# Patient Record
Sex: Female | Born: 1940 | Race: White | Hispanic: No | Marital: Married | Smoking: Never smoker
Health system: Southern US, Community
[De-identification: ages and names within clinical notes are randomized; demographics above are authoritative.]

## PROBLEM LIST (undated history)

## (undated) DIAGNOSIS — R55 Syncope and collapse: Secondary | ICD-10-CM

## (undated) DIAGNOSIS — E039 Hypothyroidism, unspecified: Secondary | ICD-10-CM

## (undated) DIAGNOSIS — R011 Cardiac murmur, unspecified: Secondary | ICD-10-CM

## (undated) DIAGNOSIS — E119 Type 2 diabetes mellitus without complications: Secondary | ICD-10-CM

## (undated) DIAGNOSIS — I2699 Other pulmonary embolism without acute cor pulmonale: Secondary | ICD-10-CM

## (undated) DIAGNOSIS — M1990 Unspecified osteoarthritis, unspecified site: Secondary | ICD-10-CM

## (undated) DIAGNOSIS — I10 Essential (primary) hypertension: Secondary | ICD-10-CM

## (undated) HISTORY — PX: CATARACT EXTRACTION: SUR2

## (undated) HISTORY — DX: Cardiac murmur, unspecified: R01.1

## (undated) HISTORY — PX: ABDOMINAL HYSTERECTOMY: SHX81

## (undated) HISTORY — PX: TONSILLECTOMY: SUR1361

## (undated) HISTORY — PX: TUBAL LIGATION: SHX77

## (undated) HISTORY — DX: Syncope and collapse: R55

---

## 2002-12-24 ENCOUNTER — Encounter: Admission: RE | Admit: 2002-12-24 | Discharge: 2003-02-13 | Payer: Self-pay | Admitting: Orthopedic Surgery

## 2003-11-11 ENCOUNTER — Emergency Department (HOSPITAL_COMMUNITY): Admission: AC | Admit: 2003-11-11 | Discharge: 2003-11-11 | Payer: Self-pay

## 2009-06-25 ENCOUNTER — Encounter: Payer: Self-pay | Admitting: Cardiovascular Disease

## 2011-12-26 ENCOUNTER — Emergency Department (HOSPITAL_COMMUNITY): Payer: Medicare Other

## 2011-12-26 ENCOUNTER — Encounter (HOSPITAL_COMMUNITY): Payer: Self-pay | Admitting: Emergency Medicine

## 2011-12-26 ENCOUNTER — Inpatient Hospital Stay (HOSPITAL_COMMUNITY)
Admission: EM | Admit: 2011-12-26 | Discharge: 2011-12-28 | DRG: 312 | Disposition: A | Discharge status: Home / Self Care | Payer: Medicare Other | Source: Ambulatory Visit | Attending: Internal Medicine | Admitting: Internal Medicine

## 2011-12-26 DIAGNOSIS — E039 Hypothyroidism, unspecified: Secondary | ICD-10-CM | POA: Diagnosis present

## 2011-12-26 DIAGNOSIS — E860 Dehydration: Secondary | ICD-10-CM | POA: Diagnosis present

## 2011-12-26 DIAGNOSIS — R55 Syncope and collapse: Principal | ICD-10-CM | POA: Diagnosis present

## 2011-12-26 DIAGNOSIS — I10 Essential (primary) hypertension: Secondary | ICD-10-CM | POA: Diagnosis present

## 2011-12-26 HISTORY — DX: Essential (primary) hypertension: I10

## 2011-12-26 HISTORY — DX: Hypothyroidism, unspecified: E03.9

## 2011-12-26 HISTORY — DX: Unspecified osteoarthritis, unspecified site: M19.90

## 2011-12-26 LAB — CBC
HCT: 42.4 % (ref 36.0–46.0)
Hemoglobin: 14.2 g/dL (ref 12.0–15.0)
MCH: 31.8 pg (ref 26.0–34.0)
MCHC: 33.5 g/dL (ref 30.0–36.0)
RDW: 13.3 % (ref 11.5–15.5)

## 2011-12-26 LAB — POCT I-STAT, CHEM 8
BUN: 33 mg/dL — ABNORMAL HIGH (ref 6–23)
Calcium, Ion: 1.22 mmol/L (ref 1.12–1.32)
Chloride: 105 mEq/L (ref 96–112)
HCT: 46 % (ref 36.0–46.0)
Potassium: 4.6 mEq/L (ref 3.5–5.1)
Sodium: 142 mEq/L (ref 135–145)

## 2011-12-26 LAB — POCT I-STAT TROPONIN I: Troponin i, poc: 0 ng/mL (ref 0.00–0.08)

## 2011-12-26 MED ORDER — SODIUM CHLORIDE 0.9 % IV SOLN
INTRAVENOUS | Status: DC
  Administered 2011-12-27 (×2): via INTRAVENOUS

## 2011-12-26 NOTE — ED Notes (Signed)
Per EMS:  Pt ambulated to the bathroom and pt reports not feeling well while she was in the bathtub - Pt attempted to get out of the bathtub and had a syncopal episode and hit her head on the edge of the tub.  Pt attempted to sit up and continues to feel dizzy.  Pt feels better when she is lying flat.  Presently pt c/o dizziness.

## 2011-12-26 NOTE — ED Provider Notes (Signed)
History     CSN: [id number]  Arrival date & time 12/26/11  2217   First MD Initiated Contact with Patient 12/26/11 2300      Chief Complaint  Patient presents with  . Loss of Consciousness    (Consider location/radiation/quality/duration/timing/severity/associated sxs/prior treatment) HPI History provided by patient. At home tonight taking a bath. She states she did not feel right and was unable to characterize this any further. She denies lightheadedness or near syncopal feeling. When she went to step out of the bath the next thing she recalls is her husband standing over her. Family bedside, states her husband heard her fall and got to her within 30 seconds. She looks like she hit the back of her head on the bathtub and does complain of posterior head pain without bleeding. Her she was acting confused and for a period of 20-30 minutes. She sat up and had a second syncopal event without any seizure activity. She denies any chest pain or shortness of breath during these episodes. Her blood pressure has been reportedly labile recently. She has been started on a pressure medication by her primary care physician in [place]. No history of arrhythmia or known heart disease. No recent illness or fevers or chills or cough. She denies any abdominal pain, nausea vomiting or diarrhea. Her daughter who lives down the road came over shortly after this happened and witnessed her second syncopal. She was concerned that she had some right facial droop for a period of minutes it resolved. No unilateral weakness otherwise. No history of stroke. No history of similar symptoms. Patient now states she feels fine and no longer with this feeling of "doesn't feel right". Blood sugar reported 200s in route by EMS.  Past Medical History  Diagnosis Date  . Hypertension     Past Surgical History  Procedure Date  . Abdominal hysterectomy     History reviewed. No pertinent family history.  History  Substance  Use Topics  . Smoking status: Never Smoker   . Smokeless tobacco: Not on file  . Alcohol Use: No    OB History    Grav Para Term Preterm Abortions TAB SAB Ect Mult Living                  Review of Systems  Constitutional: Negative for fever and chills.  HENT: Negative for neck pain and neck stiffness.   Eyes: Negative for pain.  Respiratory: Negative for shortness of breath.   Cardiovascular: Negative for chest pain.  Gastrointestinal: Negative for abdominal pain.  Genitourinary: Negative for dysuria.  Musculoskeletal: Negative for back pain.  Skin: Negative for rash.  Neurological: Positive for syncope and weakness. Negative for headaches.  All other systems reviewed and are negative.    Allergies  Review of patient's allergies indicates no known allergies.  Home Medications   Current Outpatient Rx  Name Route Sig Dispense Refill  . AMLODIPINE BESYLATE 5 MG PO TABS Oral Take 5 mg by mouth daily.    Marland Kitchen CALCIUM CARBONATE-VITAMIN D 500-200 MG-UNIT PO TABS Oral Take 1 tablet by mouth daily.    Marland Kitchen HYDROCHLOROTHIAZIDE 12.5 MG PO CAPS Oral Take 12.5 mg by mouth daily.    Marland Kitchen LEVOTHYROXINE SODIUM 112 MCG PO TABS Oral Take 112 mcg by mouth daily.      BP 126/69  Pulse 106  Temp(Src) 98 F (36.7 C) (Oral)  Resp 13  SpO2 99%  Physical Exam  Constitutional: She is oriented to person, place, and time. She  appears well-developed and well-nourished.  HENT:  Head: Normocephalic.       Posterior scalp hematoma left-sided. No laceration. TMs clear. No associated cervical tenderness or deformity.  Eyes: Conjunctivae and EOM are normal. Pupils are equal, round, and reactive to light.  Neck: Trachea normal. Neck supple.       No midline tenderness  Cardiovascular: Normal rate, regular rhythm, S1 normal, S2 normal and normal pulses.     No systolic murmur is present   No diastolic murmur is present  Pulses:      Radial pulses are 2+ on the right side, and 2+ on the left side.    Pulmonary/Chest: Effort normal and breath sounds normal. She has no wheezes. She has no rhonchi. She has no rales. She exhibits no tenderness.  Abdominal: Soft. Normal appearance and bowel sounds are normal. There is no tenderness. There is no rebound, no guarding, no CVA tenderness and negative Murphy's sign.  Musculoskeletal:       BLE:s Calves nontender, no cords or erythema, negative Homans sign  Neurological: She is alert and oriented to person, place, and time. She has normal strength. No cranial nerve deficit or sensory deficit. GCS eye subscore is 4. GCS verbal subscore is 5. GCS motor subscore is 6.       Equal grips, biceps, triceps and dorsi plantar flexion. Sensorium to light touch intact throughout. No facial droop or unilateral deficits  Skin: Skin is warm and dry. No rash noted. She is not diaphoretic.  Psychiatric: Her speech is normal.       Cooperative and appropriate    ED Course  Procedures (including critical care time)  Results for orders placed during the hospital encounter of 12/26/11  CBC      Component Value Range   WBC 18.1 (*) 4.0 - 10.5 (K/uL)   RBC 4.47  3.87 - 5.11 (MIL/uL)   Hemoglobin 14.2  12.0 - 15.0 (g/dL)   HCT 81.1  91.4 - 78.2 (%)   MCV 94.9  78.0 - 100.0 (fL)   MCH 31.8  26.0 - 34.0 (pg)   MCHC 33.5  30.0 - 36.0 (g/dL)   RDW 95.6  21.3 - 08.6 (%)   Platelets 232  150 - 400 (K/uL)  COMPREHENSIVE METABOLIC PANEL      Component Value Range   Sodium 141  135 - 145 (mEq/L)   Potassium 4.7  3.5 - 5.1 (mEq/L)   Chloride 102  96 - 112 (mEq/L)   CO2 29  19 - 32 (mEq/L)   Glucose, Bld 143 (*) 70 - 99 (mg/dL)   BUN 31 (*) 6 - 23 (mg/dL)   Creatinine, Ser 5.78  0.50 - 1.10 (mg/dL)   Calcium 9.6  8.4 - 46.9 (mg/dL)   Total Protein 7.2  6.0 - 8.3 (g/dL)   Albumin 3.7  3.5 - 5.2 (g/dL)   AST 22  0 - 37 (U/L)   ALT 22  0 - 35 (U/L)   Alkaline Phosphatase 86  39 - 117 (U/L)   Total Bilirubin 0.3  0.3 - 1.2 (mg/dL)   GFR calc non Af Amer 53 (*) >90  (mL/min)   GFR calc Af Amer 61 (*) >90 (mL/min)  URINALYSIS, ROUTINE W REFLEX MICROSCOPIC      Component Value Range   Color, Urine YELLOW  YELLOW    APPearance CLOUDY (*) CLEAR    Specific Gravity, Urine 1.024  1.005 - 1.030    pH 5.0  5.0 - 8.0  Glucose, UA NEGATIVE  NEGATIVE (mg/dL)   Hgb urine dipstick NEGATIVE  NEGATIVE    Bilirubin Urine NEGATIVE  NEGATIVE    Ketones, ur 15 (*) NEGATIVE (mg/dL)   Protein, ur NEGATIVE  NEGATIVE (mg/dL)   Urobilinogen, UA 0.2  0.0 - 1.0 (mg/dL)   Nitrite NEGATIVE  NEGATIVE    Leukocytes, UA MODERATE (*) NEGATIVE   POCT I-STAT, CHEM 8      Component Value Range   Sodium 142  135 - 145 (mEq/L)   Potassium 4.6  3.5 - 5.1 (mEq/L)   Chloride 105  96 - 112 (mEq/L)   BUN 33 (*) 6 - 23 (mg/dL)   Creatinine, Ser 1.61  0.50 - 1.10 (mg/dL)   Glucose, Bld 096 (*) 70 - 99 (mg/dL)   Calcium, Ion 0.45  4.09 - 1.32 (mmol/L)   TCO2 30  0 - 100 (mmol/L)   Hemoglobin 15.6 (*) 12.0 - 15.0 (g/dL)   HCT 81.1  91.4 - 78.2 (%)  POCT I-STAT TROPONIN I      Component Value Range   Troponin i, poc 0.00  0.00 - 0.08 (ng/mL)   Comment 3           URINE MICROSCOPIC-ADD ON      Component Value Range   Squamous Epithelial / LPF MANY (*) RARE    WBC, UA 3-6  <3 (WBC/hpf)   RBC / HPF 0-2  <3 (RBC/hpf)   Bacteria, UA RARE  RARE    Dg Chest 2 View  12/27/2011  *RADIOLOGY REPORT*  Clinical Data: Syncope.  Dizziness.  CHEST - 2 VIEW  Comparison: 11/11/2003  Findings: No cardiomegaly is observed. The lungs are clear.  Reduced definition the left hemidiaphragm is probably due to angulation during imaging and epicardial adipose tissue, as no lower lobe opacity is observed on the lateral projection.  Thoracic spondylosis noted.  IMPRESSION:  1. Thoracic spondylosis.   Otherwise, no significant abnormality identified.  Original Report Authenticated By: Dellia Cloud, M.D.   Ct Head Wo Contrast  12/27/2011  *RADIOLOGY REPORT*  Clinical Data: Fall.  Head injury.  Pain.   CT HEAD WITHOUT CONTRAST  Technique:  Contiguous axial images were obtained from the base of the skull through the vertex without contrast.  Comparison: None.  Findings: The brain stem, cerebellum, cerebral peduncles, thalami, basal ganglia, basilar cisterns, and ventricular system appear unremarkable.  No intracranial hemorrhage, mass lesion, or acute infarction is identified.  Mild periventricular white matter hypodensity probably reflects chronic ischemic microvascular white matter disease.  IMPRESSION:  1.  Mild chronic ischemic microvascular white matter disease. Otherwise, no significant abnormality identified.  Original Report Authenticated By: Dellia Cloud, M.D.      Date: 12/26/2011  Rate: 75  Rhythm: normal sinus rhythm  QRS Axis: normal  Intervals: normal  ST/T Wave abnormalities: nonspecific ST changes  Conduction Disutrbances:none  Narrative Interpretation: PAC present  Old EKG Reviewed: none available   Asymptomatic in the ED with serial evaluations.  MDM   Multiple syncopal evidence home with workup as above. Stat EKG without ischemia or arrhythmia. Labs obtained and reviewed as above. Chest x-ray and CAT scan obtained and reviewed. Medicine consult for admission is just as above with Dr. Conley Rolls, who agrees to admit.         Sunnie Nielsen, MD 12/27/11 [phone]

## 2011-12-27 ENCOUNTER — Observation Stay (HOSPITAL_COMMUNITY): Payer: Medicare Other

## 2011-12-27 ENCOUNTER — Encounter (HOSPITAL_COMMUNITY): Payer: Self-pay | Admitting: General Practice

## 2011-12-27 DIAGNOSIS — R55 Syncope and collapse: Secondary | ICD-10-CM

## 2011-12-27 DIAGNOSIS — R569 Unspecified convulsions: Secondary | ICD-10-CM

## 2011-12-27 DIAGNOSIS — I10 Essential (primary) hypertension: Secondary | ICD-10-CM

## 2011-12-27 LAB — COMPREHENSIVE METABOLIC PANEL
ALT: 22 U/L (ref 0–35)
AST: 22 U/L (ref 0–37)
Albumin: 3.7 g/dL (ref 3.5–5.2)
Alkaline Phosphatase: 86 U/L (ref 39–117)
BUN: 31 mg/dL — ABNORMAL HIGH (ref 6–23)
CO2: 29 mEq/L (ref 19–32)
Calcium: 9.6 mg/dL (ref 8.4–10.5)
Chloride: 102 mEq/L (ref 96–112)
Creatinine, Ser: 1.04 mg/dL (ref 0.50–1.10)
GFR calc Af Amer: 61 mL/min — ABNORMAL LOW (ref >90)
GFR calc non Af Amer: 53 mL/min — ABNORMAL LOW (ref >90)
Glucose, Bld: 143 mg/dL — ABNORMAL HIGH (ref 70–99)
Potassium: 4.7 mEq/L (ref 3.5–5.1)
Sodium: 141 mEq/L (ref 135–145)
Total Bilirubin: 0.3 mg/dL (ref 0.3–1.2)
Total Protein: 7.2 g/dL (ref 6.0–8.3)

## 2011-12-27 LAB — URINE MICROSCOPIC-ADD ON

## 2011-12-27 LAB — URINALYSIS, ROUTINE W REFLEX MICROSCOPIC
Bilirubin Urine: NEGATIVE
Glucose, UA: NEGATIVE mg/dL
Hgb urine dipstick: NEGATIVE
Ketones, ur: 15 mg/dL — AB
Ketones, ur: NEGATIVE mg/dL
Leukocytes, UA: NEGATIVE
Nitrite: NEGATIVE
Nitrite: NEGATIVE
Protein, ur: NEGATIVE mg/dL
Specific Gravity, Urine: 1.024 (ref 1.005–1.030)
Specific Gravity, Urine: 1.013 (ref 1.005–1.030)
Urobilinogen, UA: 0.2 mg/dL (ref 0.0–1.0)
Urobilinogen, UA: 0.2 mg/dL (ref 0.0–1.0)
pH: 5 (ref 5.0–8.0)
pH: 7.5 (ref 5.0–8.0)

## 2011-12-27 LAB — CREATININE, SERUM: Creatinine, Ser: 0.78 mg/dL (ref 0.50–1.10)

## 2011-12-27 LAB — CBC
MCH: 31.7 pg (ref 26.0–34.0)
Platelets: 208 10*3/uL (ref 150–400)
RBC: 3.91 MIL/uL (ref 3.87–5.11)
RDW: 13.6 % (ref 11.5–15.5)

## 2011-12-27 LAB — CARDIAC PANEL(CRET KIN+CKTOT+MB+TROPI)
CK, MB: 3.4 ng/mL (ref 0.3–4.0)
CK, MB: 3.1 ng/mL (ref 0.3–4.0)
Relative Index: INVALID (ref 0.0–2.5)
Troponin I: <0.3 ng/mL (ref <0.30)
Troponin I: <0.3 ng/mL (ref <0.30)

## 2011-12-27 LAB — TSH: TSH: 0.175 u[IU]/mL — ABNORMAL LOW (ref 0.350–4.500)

## 2011-12-27 MED ORDER — ONDANSETRON HCL 4 MG/2ML IJ SOLN: 4.0000 mg | Freq: Four times a day (QID) | INTRAMUSCULAR | Status: DC | PRN

## 2011-12-27 MED ORDER — SODIUM CHLORIDE 0.9 % IV SOLN: 250.0000 mL | INTRAVENOUS | Status: DC | PRN

## 2011-12-27 MED ORDER — LEVOTHYROXINE SODIUM 112 MCG PO TABS
112.0000 ug | ORAL_TABLET | Freq: Every day | ORAL | Status: DC
  Administered 2011-12-27 – 2011-12-28 (×2): 112 ug via ORAL
  Filled 2011-12-27 (×5): qty 1

## 2011-12-27 MED ORDER — SODIUM CHLORIDE 0.9 % IJ SOLN: 3.0000 mL | Freq: Two times a day (BID) | INTRAMUSCULAR | Status: DC

## 2011-12-27 MED ORDER — AMLODIPINE BESYLATE 5 MG PO TABS
5.0000 mg | ORAL_TABLET | Freq: Every day | ORAL | Status: DC
  Administered 2011-12-27 – 2011-12-28 (×2): 5 mg via ORAL
  Filled 2011-12-27 (×3): qty 1

## 2011-12-27 MED ORDER — ENOXAPARIN SODIUM 40 MG/0.4ML ~~LOC~~ SOLN
40.0000 mg | SUBCUTANEOUS | Status: DC
  Administered 2011-12-27: 40 mg via SUBCUTANEOUS
  Filled 2011-12-27 (×3): qty 0.4

## 2011-12-27 MED ORDER — SODIUM CHLORIDE 0.9 % IJ SOLN
3.0000 mL | Freq: Two times a day (BID) | INTRAMUSCULAR | Status: DC
  Administered 2011-12-28: 3 mL via INTRAVENOUS

## 2011-12-27 MED ORDER — DEXTROSE 5 % IV SOLN
1.0000 g | INTRAVENOUS | Status: DC
  Administered 2011-12-27 – 2011-12-28 (×2): 1 g via INTRAVENOUS
  Filled 2011-12-27 (×2): qty 10

## 2011-12-27 MED ORDER — IOHEXOL 350 MG/ML SOLN
60.0000 mL | Freq: Once | INTRAVENOUS | Status: AC | PRN
Start: 2011-12-27 — End: 2011-12-27
  Administered 2011-12-27: 60 mL via INTRAVENOUS

## 2011-12-27 MED ORDER — ASPIRIN EC 325 MG PO TBEC
325.0000 mg | DELAYED_RELEASE_TABLET | Freq: Every day | ORAL | Status: DC
  Administered 2011-12-27 – 2011-12-28 (×2): 325 mg via ORAL
  Filled 2011-12-27 (×2): qty 1

## 2011-12-27 MED ORDER — CALCIUM CARBONATE-VITAMIN D 500-200 MG-UNIT PO TABS
1.0000 | ORAL_TABLET | Freq: Every day | ORAL | Status: DC
  Administered 2011-12-27 – 2011-12-28 (×2): 1 via ORAL
  Filled 2011-12-27 (×3): qty 1

## 2011-12-27 MED ORDER — ONDANSETRON HCL 4 MG PO TABS: 4.0000 mg | ORAL_TABLET | Freq: Four times a day (QID) | ORAL | Status: DC | PRN

## 2011-12-27 MED ORDER — SODIUM CHLORIDE 0.9 % IJ SOLN: 3.0000 mL | INTRAMUSCULAR | Status: DC | PRN

## 2011-12-27 NOTE — H&P (Signed)
PCP:   Dwana Melena, MD, MD   Chief Complaint: 2 episodes of syncope.   HPI: Claudia Rocha is an 71 y.o. female with history of hypertension, recent compressive fracture of her spine, returned from her trip to [place], about to take a shower, felt lightheaded, and had a frank syncope. There was no reported of seizure activity, bowel bladder incontinence, chest pain or shortness of breath, but stated she had transient vertigo. She told me she has no confusion after this event. Her daughter who works as an Charity fundraiser for home health care to assist with. She stated she felt vertiginous when she turns her head. She suffered another episode of transient loss of consciousness. Though her daughter thought she might have transient facial droop, her husband and her denied such symptoms. She has no focal weakness, paresthesia, slurred speech, or any visual problems. Workup in emergency room included a head CT which was negative, a white count of 18,000, hemoglobin of 14.2 g per decaliter, normal creatinine, and unremarkable chest x-ray. Her UA shows evidence of many squamous epithelials. Her EKG was unremarkable. Hospitalist was asked to admit her for syncopal workup. Currently she is totally asymptomatic.  Rewiew of Systems:  The patient denies anorexia, fever, weight loss,, vision loss, decreased hearing, hoarseness, chest pain,  dyspnea on exertion, peripheral edema, balance deficits, hemoptysis, abdominal pain, melena, hematochezia, severe indigestion/heartburn, hematuria, incontinence, genital sores, muscle weakness, suspicious skin lesions, transient blindness, difficulty walking, depression, unusual weight change, abnormal bleeding, enlarged lymph nodes, angioedema, and breast masses.    Past Medical History  Diagnosis Date  . Hypertension     Past Surgical History  Procedure Date  . Abdominal hysterectomy     Medications:  HOME MEDS: Prior to Admission medications   Medication Sig Start Date End  Date Taking? Authorizing Provider  amLODipine (NORVASC) 5 MG tablet Take 5 mg by mouth daily.   Yes Historical Provider, MD  calcium-vitamin D (OSCAL WITH D) 500-200 MG-UNIT per tablet Take 1 tablet by mouth daily.   Yes Historical Provider, MD  hydrochlorothiazide (MICROZIDE) 12.5 MG capsule Take 12.5 mg by mouth daily.   Yes Historical Provider, MD  levothyroxine (SYNTHROID, LEVOTHROID) 112 MCG tablet Take 112 mcg by mouth daily.   Yes Historical Provider, MD     Allergies:  No Known Allergies  Social History:   reports that she has never smoked. She does not have any smokeless tobacco history on file. She reports that she does not drink alcohol. Her drug history not on file.  Family History: History reviewed. No pertinent family history.   Physical Exam: Filed Vitals:   12/27/11 0130 12/27/11 0145 12/27/11 0200 12/27/11 0215  BP: 122/56 125/59 136/62 124/55  Pulse: 76 72 72 71  Temp:      TempSrc:      Resp:      SpO2: 95% 94% 94% 93%   Blood pressure 124/55, pulse 71, temperature 98.4 F (36.9 C), temperature source Oral, resp. rate 16, SpO2 93.00%.  GEN:  Pleasant  person lying in the stretcher in no acute distress; cooperative with exam PSYCH:  alert and oriented x4; does not appear anxious or depressed; affect is appropriate. HEENT: Mucous membranes pink and anicteric; PERRLA; EOM intact; no cervical lymphadenopathy nor thyromegaly or carotid bruit; no JVD; Breasts:: Not examined CHEST WALL: No tenderness CHEST: Normal respiration, clear to auscultation bilaterally HEART: Regular rate and rhythm; no murmurs rubs or gallops BACK: No kyphosis or scoliosis; no CVA tenderness ABDOMEN: Obese, soft non-tender;  no masses, no organomegaly, normal abdominal bowel sounds; no pannus; no intertriginous candida. Rectal Exam: Not done EXTREMITIES: No bone or joint deformity; age-appropriate arthropathy of the hands and knees; no edema; no ulcerations. Genitalia: not  examined PULSES: 2+ and symmetric SKIN: Normal hydration no rash or ulceration CNS: Cranial nerves 2-12 grossly intact no focal lateralizing neurologic deficit. She has  facial symmetry with fluent speech. Tongue is midline and uvula elevated with phonation. Cerebellar exam is negative.   Labs & Imaging Results for orders placed during the hospital encounter of 12/26/11 (from the past 48 hour(s))  CBC     Status: Abnormal   Collection Time   12/26/11 11:15 PM      Component Value Range Comment   WBC 18.1 (*) 4.0 - 10.5 (K/uL)    RBC 4.47  3.87 - 5.11 (MIL/uL)    Hemoglobin 14.2  12.0 - 15.0 (g/dL)    HCT 78.2  95.6 - 21.3 (%)    MCV 94.9  78.0 - 100.0 (fL)    MCH 31.8  26.0 - 34.0 (pg)    MCHC 33.5  30.0 - 36.0 (g/dL)    RDW 08.6  57.8 - 46.9 (%)    Platelets 232  150 - 400 (K/uL)   COMPREHENSIVE METABOLIC PANEL     Status: Abnormal   Collection Time   12/26/11 11:15 PM      Component Value Range Comment   Sodium 141  135 - 145 (mEq/L)    Potassium 4.7  3.5 - 5.1 (mEq/L)    Chloride 102  96 - 112 (mEq/L)    CO2 29  19 - 32 (mEq/L)    Glucose, Bld 143 (*) 70 - 99 (mg/dL)    BUN 31 (*) 6 - 23 (mg/dL)    Creatinine, Ser 6.29  0.50 - 1.10 (mg/dL)    Calcium 9.6  8.4 - 10.5 (mg/dL)    Total Protein 7.2  6.0 - 8.3 (g/dL)    Albumin 3.7  3.5 - 5.2 (g/dL)    AST 22  0 - 37 (U/L)    ALT 22  0 - 35 (U/L)    Alkaline Phosphatase 86  39 - 117 (U/L)    Total Bilirubin 0.3  0.3 - 1.2 (mg/dL)    GFR calc non Af Amer 53 (*) >90 (mL/min)    GFR calc Af Amer 61 (*) >90 (mL/min)   POCT I-STAT TROPONIN I     Status: Normal   Collection Time   12/26/11 11:39 PM      Component Value Range Comment   Troponin i, poc 0.00  0.00 - 0.08 (ng/mL)    Comment 3            POCT I-STAT, CHEM 8     Status: Abnormal   Collection Time   12/26/11 11:41 PM      Component Value Range Comment   Sodium 142  135 - 145 (mEq/L)    Potassium 4.6  3.5 - 5.1 (mEq/L)    Chloride 105  96 - 112 (mEq/L)    BUN 33 (*)  6 - 23 (mg/dL)    Creatinine, Ser 5.28  0.50 - 1.10 (mg/dL)    Glucose, Bld 413 (*) 70 - 99 (mg/dL)    Calcium, Ion 2.44  1.12 - 1.32 (mmol/L)    TCO2 30  0 - 100 (mmol/L)    Hemoglobin 15.6 (*) 12.0 - 15.0 (g/dL)    HCT 01.0  27.2 - 53.6 (%)  URINALYSIS, ROUTINE W REFLEX MICROSCOPIC     Status: Abnormal   Collection Time   12/27/11  1:01 AM      Component Value Range Comment   Color, Urine YELLOW  YELLOW     APPearance CLOUDY (*) CLEAR     Specific Gravity, Urine 1.024  1.005 - 1.030     pH 5.0  5.0 - 8.0     Glucose, UA NEGATIVE  NEGATIVE (mg/dL)    Hgb urine dipstick NEGATIVE  NEGATIVE     Bilirubin Urine NEGATIVE  NEGATIVE     Ketones, ur 15 (*) NEGATIVE (mg/dL)    Protein, ur NEGATIVE  NEGATIVE (mg/dL)    Urobilinogen, UA 0.2  0.0 - 1.0 (mg/dL)    Nitrite NEGATIVE  NEGATIVE     Leukocytes, UA MODERATE (*) NEGATIVE    URINE MICROSCOPIC-ADD ON     Status: Abnormal   Collection Time   12/27/11  1:01 AM      Component Value Range Comment   Squamous Epithelial / LPF MANY (*) RARE     WBC, UA 3-6  <3 (WBC/hpf)    RBC / HPF 0-2  <3 (RBC/hpf)    Bacteria, UA RARE  RARE     Dg Chest 2 View  12/27/2011  *RADIOLOGY REPORT*  Clinical Data: Syncope.  Dizziness.  CHEST - 2 VIEW  Comparison: 11/11/2003  Findings: No cardiomegaly is observed. The lungs are clear.  Reduced definition the left hemidiaphragm is probably due to angulation during imaging and epicardial adipose tissue, as no lower lobe opacity is observed on the lateral projection.  Thoracic spondylosis noted.  IMPRESSION:  1. Thoracic spondylosis.   Otherwise, no significant abnormality identified.  Original Report Authenticated By: Dellia Cloud, M.D.   Ct Head Wo Contrast  12/27/2011  *RADIOLOGY REPORT*  Clinical Data: Fall.  Head injury.  Pain.  CT HEAD WITHOUT CONTRAST  Technique:  Contiguous axial images were obtained from the base of the skull through the vertex without contrast.  Comparison: None.  Findings: The  brain stem, cerebellum, cerebral peduncles, thalami, basal ganglia, basilar cisterns, and ventricular system appear unremarkable.  No intracranial hemorrhage, mass lesion, or acute infarction is identified.  Mild periventricular white matter hypodensity probably reflects chronic ischemic microvascular white matter disease.  IMPRESSION:  1.  Mild chronic ischemic microvascular white matter disease. Otherwise, no significant abnormality identified.  Original Report Authenticated By: Dellia Cloud, M.D.      Assessment Present on Admission:  .Syncope and collapse .HTN (hypertension)   PLAN: Will admit her to telemetry for syncopal workup. Will check serial CPKs and troponins, monitor her rhythm, obtain echo for heart. Although I don't think she had a seizure, will obtain EEG. This is unlikely to be TIA or stroke. I therefore have not ordered carotid or MRI of her head. Given her long trip (over 300 miles each way), and although she has no tachycardia, chest pain or shortness of breath, I think it's prudent to exclude a PE causing her syncope. Will obtain a CT pulmonary angiogram. We'll admit her to telemetry under [facility] service. I have stopped her HCTZ, but will continue her other antihypertensive medications.   Other plans as per orders.    Arsh Feutz 12/27/2011, 3:11 AM

## 2011-12-27 NOTE — Evaluation (Signed)
Physical Therapy Evaluation Patient Details Name: Claudia Rocha MRN: [id number] DOB: 07/06/1941 Today's Date: 12/27/2011 Time: 7846-9629 PT Time Calculation (min): 24 min  PT Assessment / Plan / Recommendation Clinical Impression  Pt admitted after syncopal event at home with pt stating no dizziness preceeding but just didn't feel right. Pt reports some mild nausea with extreme left gaze with end range nystagmus noted. No difficulty or dizziness with visual tracking or saccades vertically or horizontally. Pt able to perform head thrust bilaterally negative, side to sit and sit to side bilaterally no issues and pt able to perform x 2 exercises without difficulty. Pt at baseline function and no further therapy needs identified.     PT Assessment  Patent does not need any further PT services    Follow Up Recommendations  No PT follow up    Barriers to Discharge        lEquipment Recommendations       Recommendations for Other Services     Frequency      Precautions / Restrictions Precautions Precautions: None   Pertinent Vitals/Pain No dizziness or pain      Mobility  Bed Mobility Bed Mobility: Rolling Right;Right Sidelying to Sit;Left Sidelying to Sit;Sitting - Scoot to [place] of Bed;Sit to Sidelying Right;Sit to Sidelying Left Rolling Right: 6: Modified independent (Device/Increase time) Right Sidelying to Sit: 6: Modified independent (Device/Increase time);HOB flat Left Sidelying to Sit: 6: Modified independent (Device/Increase time);HOB flat Sitting - Scoot to Edge of Bed: 6: Modified independent (Device/Increase time) Sit to Sidelying Right: 6: Modified independent (Device/Increase time);HOB flat Sit to Sidelying Left: 6: Modified independent (Device/Increase time);HOB flat Transfers Transfers: Sit to Stand;Stand to Sit Sit to Stand: 6: Modified independent (Device/Increase time) Stand to Sit: 6: Modified independent (Device/Increase  time) Ambulation/Gait Ambulation/Gait Assistance: 7: Independent Ambulation Distance (Feet): 400 Feet Assistive device: None Ambulation/Gait Assistance Details: no deviations with change in speed, direction, head turns Gait Pattern: Within Functional Limits Stairs: Yes Stairs Assistance: 6: Modified independent (Device/Increase time) Stair Management Technique: One rail Left Number of Stairs: 4     Exercises     PT Diagnosis:    PT Problem List:   PT Treatment Interventions:     PT Goals    Visit Information  Last PT Received On: 12/27/11 Assistance Needed: +1    Subjective Data  Subjective: I just felt funny then I woke up buck naked on the floor Patient Stated Goal: return home   Prior Functioning  Home Living Lives With: Spouse Type of Home: House Home Access: Stairs to enter Secretary/administrator of Steps: 6 Entrance Stairs-Rails: Right Home Layout: Multi-level Alternate Level Stairs-Number of Steps: 6 Alternate Level Stairs-Rails: Right Bathroom Shower/Tub: Engineer, manufacturing systems: Standard Home Adaptive Equipment: None Prior Function Level of Independence: Independent Able to Take Stairs?: Yes Driving: Yes Vocation: Retired Musician: No difficulties    Cognition  Overall Cognitive Status: Appears within functional limits for tasks assessed/performed Arousal/Alertness: Awake/alert Orientation Level: Appears intact for tasks assessed Behavior During Session: [facility] for tasks performed    Extremity/Trunk Assessment Right Upper Extremity Assessment RUE ROM/Strength/Tone: Within functional levels Left Upper Extremity Assessment LUE ROM/Strength/Tone: Within functional levels Right Lower Extremity Assessment RLE ROM/Strength/Tone: Within functional levels Left Lower Extremity Assessment LLE ROM/Strength/Tone: Within functional levels   Balance    End of Session PT - End of Session Equipment Utilized During Treatment: Gait  belt Activity Tolerance: Patient tolerated treatment well Patient left: in chair;with call bell/phone within reach;with family/visitor present  Toney Sang Beth 12/27/2011, 5:15 PM  Delaney Meigs, PT [phone]

## 2011-12-27 NOTE — ED Notes (Signed)
Awaiting transport to CT

## 2011-12-27 NOTE — ED Notes (Signed)
Attempted to give report.  Will call back in 15 minutes.

## 2011-12-27 NOTE — Progress Notes (Signed)
  Echocardiogram 2D Echocardiogram has been performed.  Cathie Beams Deneen 12/27/2011, 1:28 PM

## 2011-12-27 NOTE — Progress Notes (Signed)
Portable EEG completed

## 2011-12-27 NOTE — Progress Notes (Signed)
*  PRELIMINARY RESULTS* Vascular Ultrasound Carotid Duplex (Doppler) has been completed.  There is evidence of elevated left internal carotid artery velocities in the mid 60-79% range. No evidence of right internal carotid artery stenosis. Bilateral antegrade vertebral artery flow.  Malachy Moan, RDMS,RDCS 12/27/2011, 5:36 PM

## 2011-12-27 NOTE — Progress Notes (Signed)
Utilization review complete 

## 2011-12-27 NOTE — Progress Notes (Signed)
Patient seen and examined, admitted by Dr. Conley Rolls this morning, briefly 71 year old female with history of hypertension, recent compressive fracture of her spine presented with syncopal episode today. H&P done by Dr. Conley Rolls reviewed.  - UA positive for ketones and moderate leukocytes, placed on Rocephin, follow urine cultures - CT in June of the chest negative for any PE - 2-D echo and carotid Dopplers pending - Hold HCTZ, continue gentle hydration, PTOT eval   Claudia Rocha M.D. [facility] 12/27/2011, 11:34 AM  Pager: [phone]

## 2011-12-28 DIAGNOSIS — I10 Essential (primary) hypertension: Secondary | ICD-10-CM

## 2011-12-28 DIAGNOSIS — R55 Syncope and collapse: Secondary | ICD-10-CM

## 2011-12-28 DIAGNOSIS — E782 Mixed hyperlipidemia: Secondary | ICD-10-CM

## 2011-12-28 LAB — BASIC METABOLIC PANEL
BUN: 17 mg/dL (ref 6–23)
Calcium: 8.7 mg/dL (ref 8.4–10.5)
Creatinine, Ser: 0.65 mg/dL (ref 0.50–1.10)
GFR calc non Af Amer: 87 mL/min — ABNORMAL LOW (ref >90)
Glucose, Bld: 107 mg/dL — ABNORMAL HIGH (ref 70–99)
Sodium: 142 mEq/L (ref 135–145)

## 2011-12-28 LAB — CBC
MCH: 31.2 pg (ref 26.0–34.0)
MCHC: 32.5 g/dL (ref 30.0–36.0)
Platelets: 219 10*3/uL (ref 150–400)
RBC: 3.91 MIL/uL (ref 3.87–5.11)
RDW: 13.7 % (ref 11.5–15.5)

## 2011-12-28 MED ORDER — CIPROFLOXACIN HCL 500 MG PO TABS: 500.0000 mg | ORAL_TABLET | Freq: Two times a day (BID) | ORAL | Status: AC

## 2011-12-28 MED ORDER — ASPIRIN 325 MG PO TBEC: 325.0000 mg | DELAYED_RELEASE_TABLET | Freq: Every day | ORAL | Status: AC

## 2011-12-28 MED ORDER — LEVOTHYROXINE SODIUM 100 MCG PO TABS
100.0000 ug | ORAL_TABLET | Freq: Every day | ORAL | Status: DC
Start: 2011-12-28 — End: 2017-01-19

## 2011-12-28 NOTE — Discharge Summary (Addendum)
Patient ID: Claudia Rocha MRN: [id number] DOB/AGE: 72-Feb-1942 72 y.o.  Admit date: 12/26/2011 Discharge date: 12/28/2011  Primary Care Physician:  Dwana Melena, MD, MD   Discharge Diagnoses:    Present on Admission:  .Syncope and collapse .HTN (hypertension) Hypothyroidism  Suspected UTI  Medication List  As of 12/28/2011 12:53 PM   STOP taking these medications         hydrochlorothiazide 12.5 MG capsule         TAKE these medications         amLODipine 5 MG tablet   Commonly known as: NORVASC   Take 5 mg by mouth daily.      aspirin 325 MG EC tablet   Take 1 tablet (325 mg total) by mouth daily.      calcium-vitamin D 500-200 MG-UNIT per tablet   Commonly known as: OSCAL WITH D   Take 1 tablet by mouth daily.      ciprofloxacin 500 MG tablet   Commonly known as: CIPRO   Take 1 tablet (500 mg total) by mouth 2 (two) times daily.      levothyroxine 100 MCG tablet   Commonly known as: SYNTHROID, LEVOTHROID   Take 1 tablet (100 mcg total) by mouth daily.             Consults:  None    Significant Diagnostic Studies:  Dg Chest 2 View  12/27/2011  *RADIOLOGY REPORT*  Clinical Data: Syncope.  Dizziness.  CHEST - 2 VIEW  Comparison: 11/11/2003  Findings: No cardiomegaly is observed. The lungs are clear.  Reduced definition the left hemidiaphragm is probably due to angulation during imaging and epicardial adipose tissue, as no lower lobe opacity is observed on the lateral projection.  Thoracic spondylosis noted.  IMPRESSION:  1. Thoracic spondylosis.   Otherwise, no significant abnormality identified.  Original Report Authenticated By: Dellia Cloud, M.D.   Ct Head Wo Contrast  12/27/2011  *RADIOLOGY REPORT*  Clinical Data: Fall.  Head injury.  Pain.  CT HEAD WITHOUT CONTRAST  Technique:  Contiguous axial images were obtained from the base of the skull through the vertex without contrast.  Comparison: None.  Findings: The brain stem, cerebellum, cerebral  peduncles, thalami, basal ganglia, basilar cisterns, and ventricular system appear unremarkable.  No intracranial hemorrhage, mass lesion, or acute infarction is identified.  Mild periventricular white matter hypodensity probably reflects chronic ischemic microvascular white matter disease.  IMPRESSION:  1.  Mild chronic ischemic microvascular white matter disease. Otherwise, no significant abnormality identified.  Original Report Authenticated By: Dellia Cloud, M.D.   Ct Angio Chest W/cm &/or Wo Cm  12/27/2011  *RADIOLOGY REPORT*  Clinical Data: Syncope.  Elevated blood pressure.  CT ANGIOGRAPHY CHEST  Technique:  Multidetector CT imaging of the chest using the standard protocol during bolus administration of intravenous contrast. Multiplanar reconstructed images including MIPs were obtained and reviewed to evaluate the vascular anatomy.  Contrast: 60mL OMNIPAQUE IOHEXOL 350 MG/ML SOLN  Comparison: None.  Findings: Technically adequate study with good opacification of the central and segmental pulmonary arteries.  No focal filling defects.  No evidence of significant pulmonary embolus.  Normal caliber thoracic aorta with calcification.  Normal heart size. Small esophageal hiatal hernia.  The esophagus is decompressed.  No significant lymphadenopathy in the chest.  No pleural effusions. Visualized portions of the upper abdominal organs are unremarkable. Dependent atelectasis in the lung bases.  No focal consolidation. No pneumothorax.  No significant interstitial change.  Airways appear patent.  Respiratory  motion artifact limits visualization of the lung fields.  Degenerative changes in the thoracic spine.  IMPRESSION: No evidence of significant pulmonary embolus.  Original Report Authenticated By: Marlon Pel, M.D.    Brief H and P: For complete details please refer to admission H and P, but in brief Claudia Rocha is an 71 y.o. female with history of hypertension, recent compressive  fracture of her spine, returned from her trip to [place], about to take a shower, felt lightheaded, and had a frank syncope. There was no reported of seizure activity, bowel bladder incontinence, chest pain or shortness of breath, but stated she had transient vertigo. She told me she has no confusion after this event. Her daughter who works as an Charity fundraiser for home health care to assist with. She stated she felt vertiginous when she turns her head. She suffered another episode of transient loss of consciousness. Though her daughter thought she might have transient facial droop, her husband and her denied such symptoms. She has no focal weakness, paresthesia, slurred speech, or any visual problems. Workup in emergency room included a head CT which was negative, a white count of 18,000, hemoglobin of 14.2 g per decaliter, normal creatinine, and unremarkable chest x-ray. Her UA shows evidence of many squamous epithelials. Her EKG was unremarkable. Hospitalist was asked to admit her for syncopal workup. Currently she is totally asymptomatic.   Hospital Course:  Patient was admitted to telemetry floor, labs showed evidence of dehydration manifested by elevated BUN at 33 and hemoconcentration with a hemoglobin of 15.6 ,HCTZ was discontinued and patient was gently hydrated. Her BUN improved to 17 and her hemoglobin decreased to 12.2 with IV fluids. Vital checked for orthostasis on admission showed no orthostasis by blood pressure criteria however was positive for orthostasis by pulse criteria when her pulse rate increased from 80 lying 1 or 6 standing. no events noted on telemetry. Cardiac enzymes were unremarkable. CTA chest showed no PE, echocardiogram was unremarkable. Carotid Doppler preliminary report showed left sided ICA stenosis 60-79%, no evidence of right ICA stenosis,patient was started on aspirin. EEG was ordered on admission and showed normal result, there was no seizures reported by history. UA showed  leukocytes and 3-6 WBCs and white blood cells were elevated on admission at 18.1 the patient was started on Rocephin IV and white blood count decreased to explain to today, repeat UA is now negative. We'll discharge on ciprofloxacin by mouth to complete 5 days of antibiotics, urine culture results still pending at the time of discharge however patient is very eager to be discharged home and she will follow with her PCP on Friday 5/17. As far as hypertension, patient was kept on amlodipine 5 mg daily, HCTZ was discontinued as above, systolic blood pressure currently running between 140 to 150s, however I will continue with amlodipine 5 mg on discharge and patient was advised to check blood pressure daily and record it to discuss with PCP on Friday for further adjustment of amlodipine dose if needed, and given suspected orthostasis am reluctant to increase her antihypertensive medicine at this time. Patient was also educated on physical maneuvers with changing position to avoid dizziness. Patient was seen by physical therapy and had vestibular evaluation done, no PT services recommended on discharge to Patient was seen and examined by me today she stated that she is feeling much better, denies any dizziness with standing or ambulation, very eager for discharge to home today. Orthostatic vital signs repeated and showed no orthostasis today.  Filed Vitals:   12/28/11 1200  BP: 145/76  Pulse: 70  Temp: 98.4 F (36.9 C)  Resp: 17    General: Alert, awake, oriented x3, in no acute distress. HEENT: No bruits, no goiter. Heart: Regular rate and rhythm, without murmurs, rubs, gallops. Lungs: Clear to auscultation bilaterally. Abdomen: Soft, nontender, nondistended, positive bowel sounds. Extremities: No clubbing cyanosis or edema with positive pedal pulses. Neuro: Grossly intact, nonfocal.   Disposition and Follow-up:  To home With PCP as scheduled on Friday Time spent on Discharge: Approximately 45  minutes   Signed: Olivya Sobol 12/28/2011, 12:53 PM

## 2011-12-28 NOTE — Discharge Instructions (Signed)
STROKE/TIA DISCHARGE INSTRUCTIONS SMOKING Cigarette smoking nearly doubles your risk of having a stroke & is the single most alterable risk factor  If you smoke or have smoked in the last 12 months, you are advised to quit smoking for your health.  Most of the excess cardiovascular risk related to smoking disappears within a year of stopping.  Ask you doctor about anti-smoking medications  [place] Quit Line: 1-800-QUIT NOW  Free Smoking Cessation Classes [phone]  CHOLESTEROL Know your levels; limit fat & cholesterol in your diet  Lipid Panel  No results found for this basename: chol, trig, hdl, cholhdl, vldl, ldlcalc      Many patients benefit from treatment even if their cholesterol is at goal.  Goal: Total Cholesterol (CHOL) less than 160  Goal:  Triglycerides (TRIG) less than 150  Goal:  HDL greater than 40  Goal:  LDL (LDLCALC) less than 100   BLOOD PRESSURE American Stroke Association blood pressure target is less that 120/80 mm/Hg  Your discharge blood pressure is:  BP: 145/76 mmHg  Monitor your blood pressure  Limit your salt and alcohol intake  Many individuals will require more than one medication for high blood pressure  DIABETES (A1c is a blood sugar average for last 3 months) Goal HGBA1c is under 7% (HBGA1c is blood sugar average for last 3 months)  Diabetes: No known diagnosis of diabetes    No results found for this basename: HGBA1C     Your HGBA1c can be lowered with medications, healthy diet, and exercise.  Check your blood sugar as directed by your physician  Call your physician if you experience unexplained or low blood sugars.  PHYSICAL ACTIVITY/REHABILITATION Goal is 30 minutes at least 4 days per week  Activity: Increase activity slowly, Therapies:  Return to work:   Activity decreases your risk of heart attack and stroke and makes your heart stronger.  It helps control your weight and blood pressure; helps you relax and can improve your  mood.  Participate in a regular exercise program.  Talk with your doctor about the best form of exercise for you (dancing, walking, swimming, cycling).  DIET/WEIGHT Goal is to maintain a healthy weight  Your discharge diet is: Cardiac REGULAR liquids Your height is:  Height: 5\' 3"  (160 cm) Your current weight is: Weight: 84.5 kg (186 lb 4.6 oz) Your Body Mass Index (BMI) is:  BMI (Calculated): 33.1   Following the type of diet specifically designed for you will help prevent another stroke.  Your goal weight range is: 107-135  Your goal Body Mass Index (BMI) is 19-24.  Healthy food habits can help reduce 3 risk factors for stroke:  High cholesterol, hypertension, and excess weight.  RESOURCES Stroke/Support Group:  Call [phone]   STROKE EDUCATION PROVIDED/REVIEWED AND GIVEN TO PATIENT Stroke warning signs and symptoms How to activate emergency medical system (call 911). Medications prescribed at discharge. Need for follow-up after discharge. Personal risk factors for stroke. Pneumonia vaccine given: No Flu vaccine given: No My questions have been answered, the writing is legible, and I understand these instructions.  I will adhere to these goals & educational materials that have been provided to me after my discharge from the hospital.     Stroke Prevention Some medical conditions and behaviors are associated with an increased chance of having a stroke. You may prevent a stroke by making healthy choices and managing medical conditions. Reduce your risk of having a stroke by:  Staying physically active. Get at least  30 minutes of activity on most or all days.   Not smoking. It may also be helpful to avoid exposure to secondhand smoke.   Limiting alcohol use. Moderate alcohol use is considered to be:   No more than 2 drinks per day for men.   No more than 1 drink per day for nonpregnant women.   Eating healthy foods.   Include 5 or more servings of fruits and vegetables  a day.   Certain diets may be prescribed to address high blood pressure, high cholesterol, diabetes, or obesity.   Managing your cholesterol levels.   A low-saturated fat, low-trans fat, low-cholesterol, and high-fiber diet may control cholesterol levels.   Take any prescribed medicines to control cholesterol as directed by your caregiver.   Managing your diabetes.   A controlled-carbohydrate, controlled-sugar diet is recommended to manage diabetes.   Take any prescribed medicines to control diabetes as directed by your caregiver.   Controlling your high blood pressure (hypertension).   A low-salt (sodium), low-saturated fat, low-trans fat, and low-cholesterol diet is recommended to manage high blood pressure.   Take any prescribed medicines to control hypertension as directed by your caregiver.   Maintaining a healthy weight.   A reduced-calorie, low-sodium, low-saturated fat, low-trans fat, low-cholesterol diet is recommended to manage weight.   Stopping drug abuse.   Avoiding birth control pills.   Talk to your caregiver about the risks of taking birth control pills if you are over 69 years old, smoke, get migraines, or have ever had a blood clot.   Getting evaluated for sleep disorders (sleep apnea).   Talk to your caregiver about getting a sleep evaluation if you snore a lot or have excessive sleepiness.   Taking medicines as directed by your caregiver.   For some people, aspirin or blood thinners (anticoagulants) are helpful in reducing the risk of forming abnormal blood clots that can lead to stroke. If you have the irregular heart rhythm of atrial fibrillation, you should be on a blood thinner unless there is a good reason you cannot take them.   Understand all your medicine instructions.  SEEK IMMEDIATE MEDICAL CARE IF:   You have sudden weakness or numbness of the face, arm, or leg, especially on one side of the body.   You have sudden confusion.   You have  trouble speaking (aphasia) or understanding.   You have sudden trouble seeing in one or both eyes.   You have sudden trouble walking.   You have dizziness.   You have a loss of balance or coordination.   You have a sudden, severe headache with no known cause.   You have new chest pain or an irregular heartbeat.  Any of these symptoms may represent a serious problem that is an emergency. Do not wait to see if the symptoms will go away. Get medical help right away. Call your local emergency services (911 in [place] Do not drive yourself to the hospital. Document Released: 09/08/2004 Document Revised: 07/21/2011 Document Reviewed: 03/21/2011 [facility] Patient Information 2012 [place], [place].

## 2011-12-28 NOTE — Clinical Documentation Improvement (Signed)
GENERIC DOCUMENTATION CLARIFICATION QUERY  THIS DOCUMENT IS NOT A PERMANENT PART OF THE MEDICAL RECORD  TO RESPOND TO THE THIS QUERY, FOLLOW THE INSTRUCTIONS BELOW:  1. If needed, update documentation for the patient's encounter via the notes activity.  2. Access this query again and click edit on the In [employer].  3. After updating, or not, click F2 to complete all highlighted (required) fields concerning your review. Select "additional documentation in the medical record" OR "no additional documentation provided".  4. Click Sign note button.  5. The deficiency will fall out of your In Basket *Please let us know if you are not able to complete this workflow by phone or e-mail (listed below).  Please update your documentation within the medical record to reflect your response to this query.                                                                                        12/28/11   Dear Dr. Cleotis Lema / Associates,  In a better effort to capture your patient's severity of illness, reflect appropriate length of stay and utilization of resources, a review of the patient medical record has revealed the following indicators.  THANK YOU FOR CLARIFYING DIAGNOSIS BEING TREATED.  Possible Clinical Conditions? - UTI - Possible UTI - Other Condition (please specify) - Cannot Clinically Determine  Supporting Information: - Risk Factors: syncope, 5/14:"UA positive for ketones and moderate leukocytes" - Diagnostics: WBC 18.1, UA : cloudy, Ketones: 15, Leuks: moderate, Squamous: Many, WBC:3-6, Bact:rare - Treatment: 5/14: UA, "placed on Rocephin, follow urine cultures"  You may use possible, probable, or suspect with inpatient documentation. possible, probable, suspected diagnoses MUST be documented at the time of discharge  Reviewed: additional documentation in the medical record  Thank You,  Beverley Fiedler RN Clinical Documentation Specialist: [phone] Health Information  Management [place]

## 2011-12-29 NOTE — Procedures (Signed)
EEG NUMBER:  [id number]  This routine EEG was requested in this 71 year old, who was admitted for syncope, who collapsed at home.  She is on no anticonvulsant medication.  The EEG was done with the patient awake.  During periods of maximal wakefulness, she had a 10-cycle per second posterior dominant rhythm that attenuated with eye opening and was symmetric.  Background activities were composed of low-amplitude frontally dominant beta activities that were symmetric.  Photic stimulation produced symmetric driving response. Hyperventilation did not markedly change the tracing.  The patient did sleep.  CLINICAL INTERPRETATION:  This routine EEG patient awake is normal.          ______________________________ Denton Meek, MD    JX:[id number] D:  12/28/2011 06:01:50  T:  12/28/2011 06:17:11  Job #:  [id number]

## 2011-12-30 LAB — URINE CULTURE

## 2012-01-05 ENCOUNTER — Encounter: Payer: Self-pay | Admitting: *Deleted

## 2012-02-06 ENCOUNTER — Ambulatory Visit: Payer: Medicare Other | Admitting: Cardiology

## 2012-04-23 ENCOUNTER — Ambulatory Visit (INDEPENDENT_AMBULATORY_CARE_PROVIDER_SITE_OTHER): Payer: Medicare Other | Admitting: Internal Medicine

## 2012-04-23 ENCOUNTER — Encounter: Payer: Self-pay | Admitting: Internal Medicine

## 2012-04-23 ENCOUNTER — Other Ambulatory Visit: Payer: Self-pay

## 2012-04-23 VITALS — BP 148/70 | HR 76 | Ht 63.0 in | Wt 186.8 lb

## 2012-04-23 DIAGNOSIS — I10 Essential (primary) hypertension: Secondary | ICD-10-CM

## 2012-04-23 DIAGNOSIS — G4710 Hypersomnia, unspecified: Secondary | ICD-10-CM | POA: Insufficient documentation

## 2012-04-23 DIAGNOSIS — R519 Headache, unspecified: Secondary | ICD-10-CM

## 2012-04-23 DIAGNOSIS — R55 Syncope and collapse: Secondary | ICD-10-CM

## 2012-04-23 DIAGNOSIS — R51 Headache: Secondary | ICD-10-CM

## 2012-04-23 DIAGNOSIS — G4733 Obstructive sleep apnea (adult) (pediatric): Secondary | ICD-10-CM

## 2012-04-23 NOTE — Assessment & Plan Note (Signed)
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 We have discussed strategies to try to avoid this. It is likely a consequence of a setting of her hypertension.

## 2012-04-23 NOTE — Assessment & Plan Note (Signed)
Is unusual to have significant hypertension start off a 71 year old. As such, especially given her carotid vascular disease, will undertake renal vascular ultrasound to look for a secondary cause. In addition, she has significant daytime somnolence he'll need a sleep study to exclude obstructive sleep apnea as a potential trigger

## 2012-04-23 NOTE — Assessment & Plan Note (Signed)
The patient has a moving sensation in her head ipsilateral to her carotid stenosis. In the past my understanding has been pounding in the head is frequently related to a mechanical consequence of a increasingly stiff vessel passing through the foramen of the skull. I will check with vascular surgery to see if they have any other explanations here

## 2012-04-23 NOTE — Progress Notes (Signed)
CARDIOLOGY CONSULT NOTE  Patient ID: JASZMINE NAVEJAS, MRN: [id number], DOB/AGE: [birth year] 71 y.o. Admit date: (Not on file) Date of Consult: 04/23/2012  Primary Physician: Dwana Melena, MD Primary Cardiologist: new  Chief Complaint: Pounding in her left ear   HPI Claudia Rocha is a 71 y.o. female  seen at her own request. She was hospitalized in May following the first of 2 syncopal episodes. This occurred following a trip to [place]. She had gone up from bed to take a bath. He felt weak and lightheaded when she stopped. She got her back to an progressively got weaker. He still out of the bathtub and collapsed. Her husband took her blood pressure shortly thereafter; he recalled 135. Her daughter who is a nurse had noted that the blood pressure was   low. EMS was called. She was taken to [facility] for evaluation was consistent with intravascular depletion based on hemoconcentration and an elevated BUN. Her hydrochlorothiazide was discontinued and she was discharged.  Further evaluation included an echocardiogram that was entirely normal not withstanding a soft murmur and carotid Dopplers which showed a 60-79% left carotid stenosis. She she has a new diagnosis of hypertension dating back only about a year. She was at the time of her hospitalization on a diuretic as noted. She currently is taking amlodipine as well as lisinopril.  She denies exercise intolerance. She does have modest snoring. She has occasional edema.  Her other major complaint to his pounding in her left ear which is somewhat positional, worse at night and not relieved necessarily by changing the orientation of her head. Apparently she has also had an MRI MRA of her head records which we do not have the husband recalls it being told that it was normal; a noncontrast CT was normal     Past Medical History  Diagnosis Date  . Hypertension   . Hypothyroidism   . Arthritis     spine  . Heart murmur   . Diastolic  dysfunction       Surgical History:  Past Surgical History  Procedure Date  . Abdominal hysterectomy   . Tonsillectomy   . Compression fracture 12/2011     Home Meds: Prior to Admission medications   Medication Sig Start Date End Date Taking? Authorizing Provider  amLODipine (NORVASC) 5 MG tablet Take 5 mg by mouth daily.   Yes Historical Provider, MD  aspirin 325 MG tablet Take 325 mg by mouth daily.   Yes Historical Provider, MD  Cholecalciferol (VITAMIN D-3) 1000 UNITS CAPS Take 2,000 Units by mouth daily.   Yes Historical Provider, MD  levothyroxine (SYNTHROID) 100 MCG tablet Take 1 tablet (100 mcg total) by mouth daily. 12/28/11 12/27/12 Yes Sosan Forrestine Him, MD  lisinopril (PRINIVIL,ZESTRIL) 10 MG tablet Take 10 mg by mouth daily.   Yes Historical Provider, MD    Allergies: No Known Allergies  History   Social History  . Marital Status: Married    Spouse Name: N/A    Number of Children: N/A  . Years of Education: N/A   Occupational History  . Not on file.   Social History Main Topics  . Smoking status: Never Smoker   . Smokeless tobacco: Never Used  . Alcohol Use: No  . Drug Use:   . Sexually Active: Not Currently    Birth Control/ Protection: Post-menopausal   Other Topics Concern  . Not on file   Social History Narrative  . No narrative on file  No family history on file.   ROS:  Please see the history of present illness.   Negative except mild arthiritis  All other systems reviewed and negative.    Physical Exam: Blood pressure 148/70, pulse 76, height 5\' 3"  (1.6 m), weight 186 lb 12.8 oz (84.732 kg). General: Well developed, well nourished female in no acute distress. Head: Normocephalic, atraumatic, sclera non-icteric, no xanthomas, nares are without discharge. Lymph Nodes:  none Neck: Left  carotid bruits. JVD not elevated. Back:without scoliosis kyphosis Lungs: Clear bilaterally to auscultation without wheezes, rales, or rhonchi. Breathing is  unlabored. Heart: RRR with S1 S2. 2/6 systolic murmur . No rubs, or gallops appreciated. Abdomen: Soft, non-tender, non-distended with normoactive bowel sounds. No hepatomegaly. No rebound/guarding. No obvious abdominal masses. Msk:  Strength and tone appear normal for age. Extremities: No clubbing or cyanosis. trace edema.  Distal pedal pulses are 2+ and equal bilaterally. Skin: Warm and Dry Neuro: Alert and oriented X 3. CN III-XII intact Grossly normal sensory and motor function . Psych:  Responds to questions appropriately with a normal affect.      Labs: Cardiac Enzymes No results found for this basename: CKTOTAL:4,CKMB:4,TROPONINI:4 in the last 72 hours CBC Lab Results  Component Value Date   WBC 6.2 12/28/2011   HGB 12.2 12/28/2011   HCT 37.5 12/28/2011   MCV 95.9 12/28/2011   PLT 219 12/28/2011   d.  EKG: Sinus rhythm at 76 Interval 17/08/38 Intra-atrial conduction delay Poor R-wave progression Otherwise normal   Assessment and Plan: Sherryl Manges

## 2012-04-23 NOTE — Patient Instructions (Addendum)
   Renal ultrasound   Sleep study - [facility]  Office will contact with results  Follow up as needed

## 2012-04-23 NOTE — Assessment & Plan Note (Signed)
With hypersomnolence and hypertension a sleep study is an order

## 2012-07-19 ENCOUNTER — Encounter (INDEPENDENT_AMBULATORY_CARE_PROVIDER_SITE_OTHER): Payer: Medicare Other

## 2012-07-19 DIAGNOSIS — I10 Essential (primary) hypertension: Secondary | ICD-10-CM

## 2012-08-13 ENCOUNTER — Ambulatory Visit (INDEPENDENT_AMBULATORY_CARE_PROVIDER_SITE_OTHER): Payer: Medicare Other | Admitting: Cardiology

## 2012-08-13 ENCOUNTER — Encounter: Payer: Self-pay | Admitting: Cardiology

## 2012-08-13 VITALS — BP 134/76 | HR 77 | Ht 63.0 in | Wt 188.0 lb

## 2012-08-13 DIAGNOSIS — I6529 Occlusion and stenosis of unspecified carotid artery: Secondary | ICD-10-CM

## 2012-08-13 MED ORDER — LOSARTAN POTASSIUM 50 MG PO TABS: 50.0000 mg | ORAL_TABLET | Freq: Every day | ORAL | Status: DC

## 2012-08-13 NOTE — Patient Instructions (Signed)
   Stop Lisinopril  Change to Cozaar (Losartan) 50mg  daily Continue all other current medications. Referral to Vascular Services for carotid stenosis Your physician wants you to follow up in: 6 months.  You will receive a reminder letter in the mail one-two months in advance.  If you don't receive a letter, please call our office to schedule the follow up appointment

## 2012-08-13 NOTE — Progress Notes (Signed)
HPI The patient presents for followup after a syncopal episode. She saw Dr. Graciela Husbands and it was felt that her event was likely related to orthostasis. She's had an extensive workup with a head CT that was negative.  She has had an echocardiogram. She has seen ENT.  The etiology of the event was unclear. Since then she did have one episode where she went down to the ground but she did not lose consciousness. She has not felt any palpitations, presyncope or syncope. This was not related to positional change. She has not had any chest pressure, neck or arm discomfort. She has not had any palpitations. She does continue to get some discomfort behind her right year. Her biggest complaint is still a pulsation or rushing in her ear.    No Known Allergies  Current Outpatient Prescriptions  Medication Sig Dispense Refill  . amLODipine (NORVASC) 5 MG tablet Take 5 mg by mouth daily.      Marland Kitchen aspirin 81 MG tablet Take 81 mg by mouth daily.      . Cholecalciferol (VITAMIN D-3) 1000 UNITS CAPS Take 2,000 Units by mouth daily.      Marland Kitchen levothyroxine (SYNTHROID) 100 MCG tablet Take 1 tablet (100 mcg total) by mouth daily.  30 tablet  0  . lisinopril (PRINIVIL,ZESTRIL) 10 MG tablet Take 10 mg by mouth daily.        Past Medical History  Diagnosis Date  . Hypertension     new onset   . Hypothyroidism   . Arthritis     spine  . Heart murmur   . Carotid stenosis     L 60-79 12/2011  . Syncope     Past Surgical History  Procedure Date  . Abdominal hysterectomy   . Tonsillectomy   . Compression fracture 12/2011    ROS:  As stated in the HPI and negative for all other systems.  PHYSICAL EXAM BP 134/76  Pulse 77  Ht 5\' 3"  (1.6 m)  Wt 188 lb (85.276 kg)  BMI 33.30 kg/m2  SpO2 96% GENERAL:  Well appearing HEENT:  Pupils equal round and reactive, fundi not visualized, oral mucosa unremarkable NECK:  No jugular venous distention, waveform within normal limits, carotid upstroke brisk and symmetric, left  carotid bruits, no thyromegaly LYMPHATICS:  No cervical, inguinal adenopathy LUNGS:  Clear to auscultation bilaterally BACK:  No CVA tenderness CHEST:  Unremarkable HEART:  PMI not displaced or sustained,S1 and S2 within normal limits, no S3, no S4, no clicks, no rubs, no murmurs ABD:  Flat, positive bowel sounds normal in frequency in pitch, no bruits, no rebound, no guarding, no midline pulsatile mass, no hepatomegaly, no splenomegaly EXT:  2 plus pulses throughout, no edema, no cyanosis no clubbing SKIN:  No rashes no nodules NEURO:  Cranial nerves II through XII grossly intact, motor grossly intact throughout PSYCH:  Cognitively intact, oriented to person place and time   ASSESSMENT AND PLAN   Pounding in head -  She continues to have this as her predominant complaint. ENT evaluation was unrevealing. I have suggested that she see the vascular surgeons since she does have known carotid stenosis and this complaint. I will arrange this. I would be happy to defer followup carotid Dopplers to [facility].   Syncope and collapse -  This was felt initially to be orthostasis. Her second event was probably not clear loss of consciousness. At this point no further workup is suggested.  HTN (hypertension) - Given the cough is starting  the ACE inhibitor I will switch to Cozaar 50 mg daily. She is actually at her target blood pressure for her age according to new guidelines. Her systolics at home are running in the 140-150 range.  Carotid stenosis - She will be referred as above.

## 2012-08-24 ENCOUNTER — Other Ambulatory Visit: Payer: Self-pay | Admitting: *Deleted

## 2012-08-24 DIAGNOSIS — R55 Syncope and collapse: Secondary | ICD-10-CM

## 2012-08-24 DIAGNOSIS — I6529 Occlusion and stenosis of unspecified carotid artery: Secondary | ICD-10-CM

## 2012-09-11 ENCOUNTER — Encounter: Payer: Self-pay | Admitting: Vascular Surgery

## 2012-09-12 ENCOUNTER — Other Ambulatory Visit (INDEPENDENT_AMBULATORY_CARE_PROVIDER_SITE_OTHER): Payer: Medicare Other | Admitting: *Deleted

## 2012-09-12 ENCOUNTER — Ambulatory Visit (INDEPENDENT_AMBULATORY_CARE_PROVIDER_SITE_OTHER): Payer: Medicare Other | Admitting: Vascular Surgery

## 2012-09-12 ENCOUNTER — Encounter: Payer: Self-pay | Admitting: Vascular Surgery

## 2012-09-12 VITALS — BP 153/65 | HR 67 | Ht 63.0 in | Wt 189.8 lb

## 2012-09-12 DIAGNOSIS — I6529 Occlusion and stenosis of unspecified carotid artery: Secondary | ICD-10-CM

## 2012-09-12 DIAGNOSIS — R55 Syncope and collapse: Secondary | ICD-10-CM

## 2012-09-12 NOTE — Progress Notes (Signed)
[facility] [place] [facility] [place]  Patient name: Claudia Rocha MRN: [id number] DOB: 02/06/1941 Sex: female  REASON FOR CONSULT: evaluate for carotid disease. Referred by Dr. Antoine Poche  HPI: Claudia Rocha is a 72 y.o. female who had a syncopal episode in may of 2013 and underwent an extensive workup for this. Ultimately it was felt that she likely had orthostasis. Part of her workup included a carotid duplex scan on 12/27/2011 which showed elevated velocities in the left internal carotid artery suggesting a 60-79% stenosis. She denies any previous history of stroke, TIAs, expressive or receptive aphasia, or amaurosis fugax. She was sent for a carotid evaluation.  She also states that she had a sudden increase in her blood pressure which prompted a renal artery duplex. The study was somewhat challenging technically but no significant renal artery stenosis was identified on duplex.  She does complain of a swishing noise in her left ear.  Past Medical History  Diagnosis Date  . Hypertension     new onset   . Hypothyroidism   . Arthritis     spine  . Heart murmur   . Carotid stenosis     L 60-79 12/2011  . Syncope     History reviewed. No pertinent family history.  SOCIAL HISTORY: History  Substance Use Topics  . Smoking status: Never Smoker   . Smokeless tobacco: Never Used  . Alcohol Use: No    No Known Allergies  Current Outpatient Prescriptions  Medication Sig Dispense Refill  . amLODipine (NORVASC) 5 MG tablet Take 5 mg by mouth daily.      Marland Kitchen aspirin 81 MG tablet Take 81 mg by mouth daily.      . Calcium Carb-Cholecalciferol (RA CALCIUM PLUS VITAMIN D3 PO) Take 1,000 Int'l Units by mouth daily.      . Cholecalciferol (VITAMIN D-3) 1000 UNITS CAPS Take 2,000 Units by mouth daily.      Marland Kitchen levothyroxine (SYNTHROID) 100 MCG tablet Take 1 tablet (100 mcg total) by mouth daily.  30 tablet  0  . losartan (COZAAR) 50 MG tablet Take 1 tablet (50 mg total) by mouth daily.   30 tablet  6    REVIEW OF SYSTEMS: Arly.Keller ] denotes positive finding; [  ] denotes negative finding  CARDIOVASCULAR:  [ ]  chest pain   [ ]  chest pressure   [ ]  palpitations   [ ]  orthopnea   [ ]  dyspnea on exertion   [ ]  claudication   [ ]  rest pain   [ ]  DVT   [ ]  phlebitis PULMONARY:   [ ]  productive cough   [ ]  asthma   [ ]  wheezing NEUROLOGIC:   [ ]  weakness  [ ]  paresthesias  [ ]  aphasia  [ ]  amaurosis  [ ]  dizziness HEMATOLOGIC:   [ ]  bleeding problems   [ ]  clotting disorders MUSCULOSKELETAL:  [ ]  joint pain   [ ]  joint swelling [ ]  leg swelling GASTROINTESTINAL: [ ]   blood in stool  [ ]   hematemesis GENITOURINARY:  [ ]   dysuria  [ ]   hematuria PSYCHIATRIC:  [ ]  history of major depression INTEGUMENTARY:  [ ]  rashes  [ ]  ulcers CONSTITUTIONAL:  [ ]  fever   [ ]  chills  PHYSICAL EXAM: Filed Vitals:   09/12/12 1203 09/12/12 1205  BP: 160/71 153/65  Pulse: 67   Height: 5\' 3"  (1.6 m)   Weight: 189 lb 12.8 oz (86.093 kg)   SpO2: 100%  Body mass index is 33.62 kg/(m^2). GENERAL: The patient is a well-nourished female, in no acute distress. The vital signs are documented above. CARDIOVASCULAR: There is a regular rate and rhythm. She does have a left carotid bruit. Both feet are warm and well-perfused. He has no significant lower extremity swelling. PULMONARY: There is good air exchange bilaterally without wheezing or rales. ABDOMEN: Soft and non-tender with normal pitched bowel sounds.  MUSCULOSKELETAL: There are no major deformities or cyanosis. NEUROLOGIC: No focal weakness or paresthesias are detected. SKIN: There are no ulcers or rashes noted. PSYCHIATRIC: The patient has a normal affect.  DATA:  I have independently interpret her carotid duplex scan which shows mildly elevated velocities in the left proximal common carotid artery and also a stenosis in the left external carotid artery. No plaque is identified in the internal carotid artery or common carotid artery on the  left.and the vessels are not tortuous. There are no elevated velocities in the right carotid system.   MEDICAL ISSUES: This patient has not had any focal neurologic symptoms. She does have some elevated velocities on the left and I think this could potentially be related to fibromuscular dysplasia although it would be impossible to diagnose without cerebral arteriography. Given that she is asymptomatic I would not want to put her through the risk of a cerebral  arteriogram (1% risk of stroke). Fibromuscular dysplasia could also be seen in the renal arteries and that might not be seen on duplex. If she continues to have problems with her blood pressure consideration could be given to CT angiogram to evaluate her renals. With respect to her carotids however, I would simply favor a follow duplex scan in 1 year and she will have that done at the [place] office. Certainly if she develops any new neurologic symptoms or progression her disease I will be happy to see her back. She does know to continue taking her aspirin. I did reassure her that she has no evidence of significant carotid disease except for the left external carotid stenosis which is not clinically significant. Likewise vertebral arteries are patent with normally directed flow and I do not think she has evidence of vertebral basilar insufficiency.   DICKSON,CHRISTOPHER S [facility]: [phone]

## 2012-11-14 IMAGING — CT CT ANGIO CHEST
2 of 6 series · 19 of 46 positions shown · IV contrast (omnipaque)
Comparison: None.

CLINICAL DATA: Syncope.  Elevated blood pressure.

CT ANGIOGRAPHY CHEST
TECHNIQUE: Multidetector CT imaging of the chest using the
standard protocol during bolus administration of intravenous
contrast. Multiplanar reconstructed images including MIPs were
obtained and reviewed to evaluate the vascular anatomy.
Contrast: 60mL OMNIPAQUE IOHEXOL 350 MG/ML SOLN

[Series 6: pulm embolism 1.0 b25f thin · axial · 0.60mm/px · z∈[-320,-48]mm · 16 of 301 slices shown]
[im 14/301  lung]
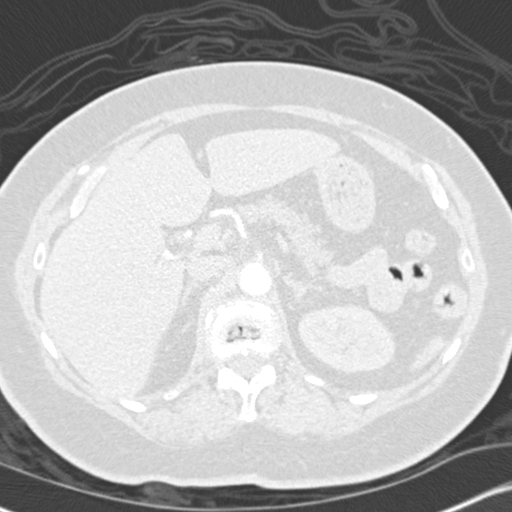
[im 40/301  soft-tissue]
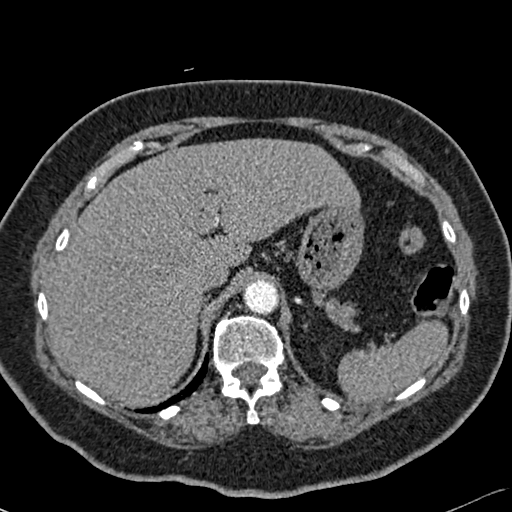
[im 53/301  lung]
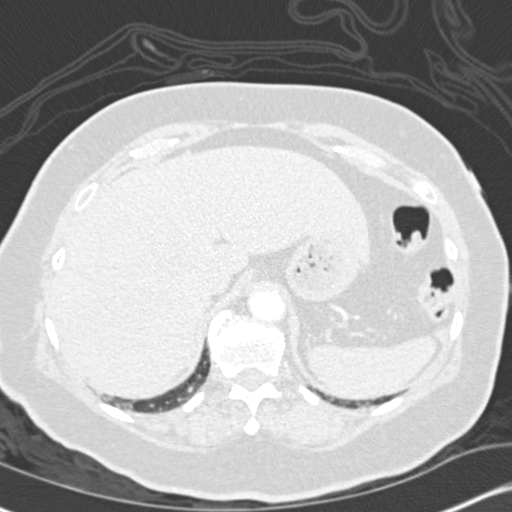
[im 66/301  soft-tissue]
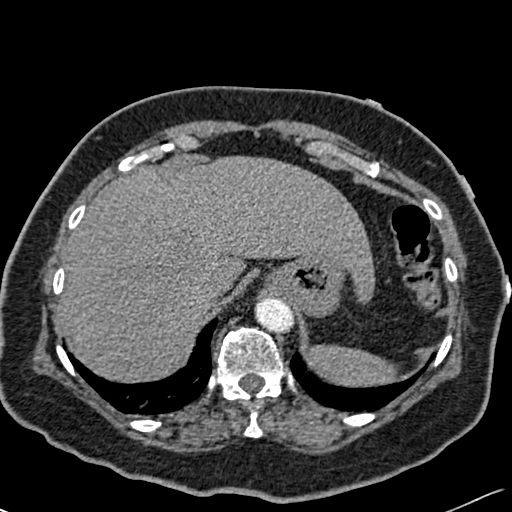
[im 92/301  lung]
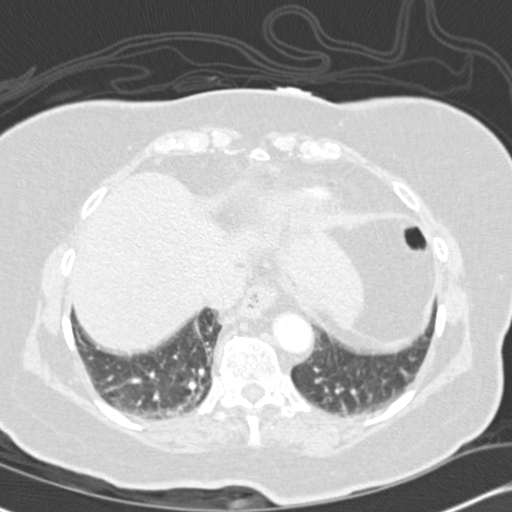
[im 105/301  soft-tissue]
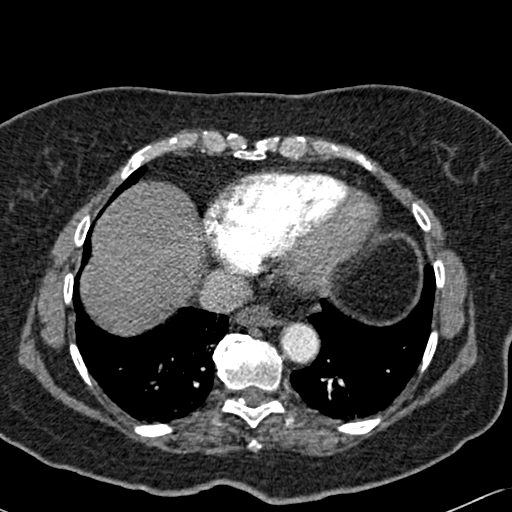
[im 118/301  lung]
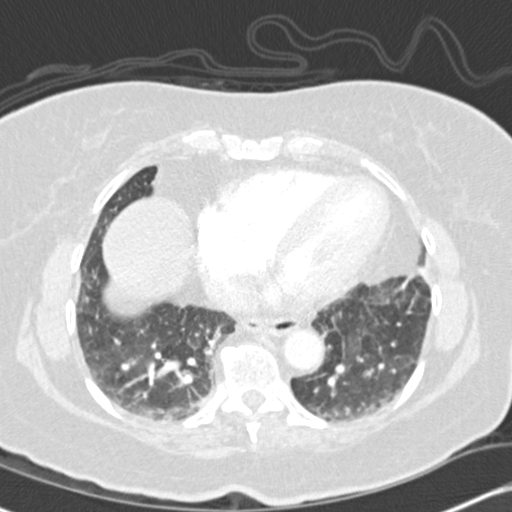
[im 144/301  soft-tissue]
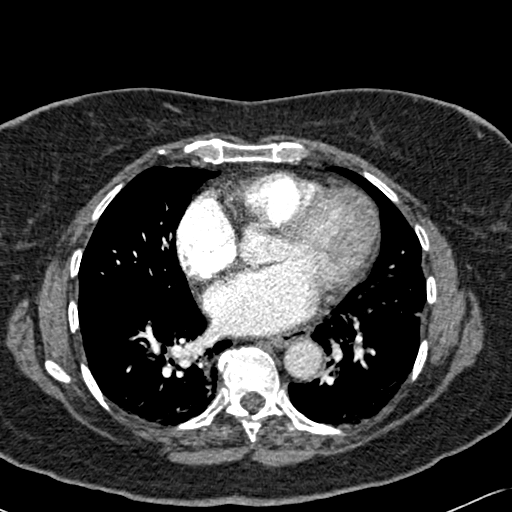
[im 157/301  lung]
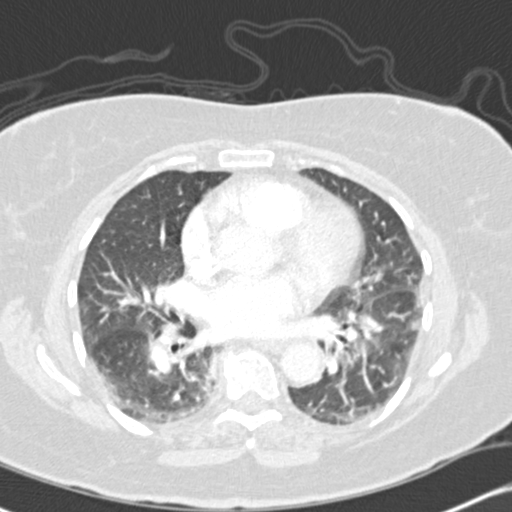
[im 183/301  soft-tissue]
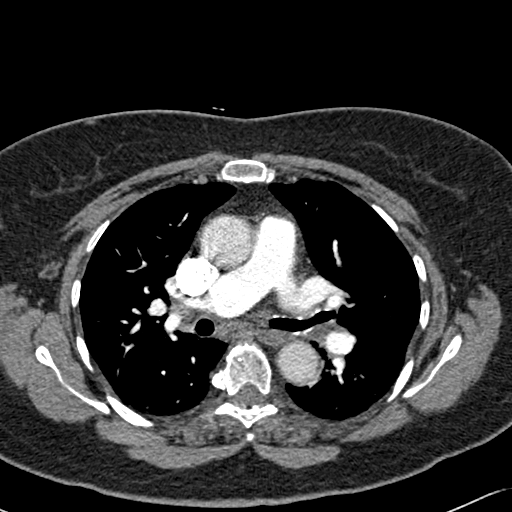
[im 196/301  lung]
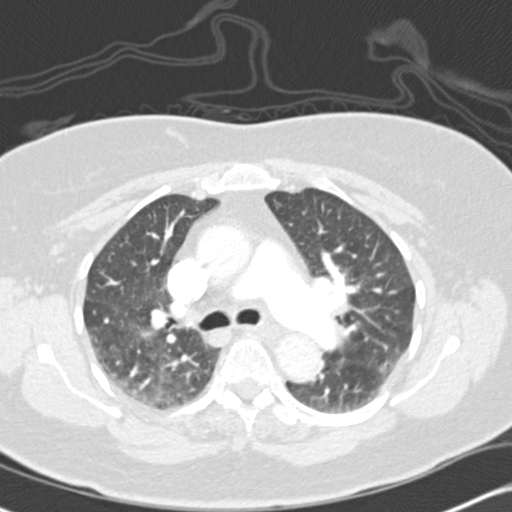
[im 209/301  soft-tissue]
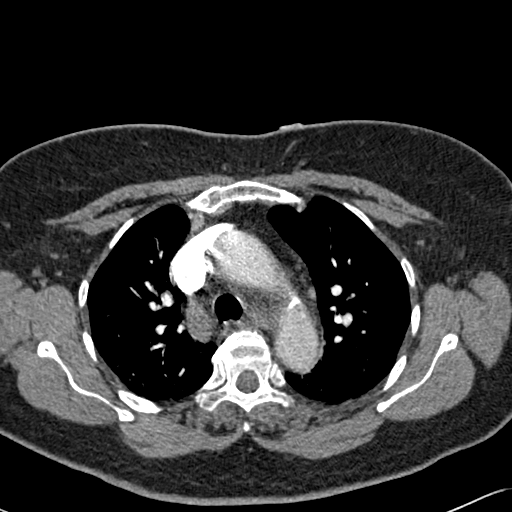
[im 235/301  lung]
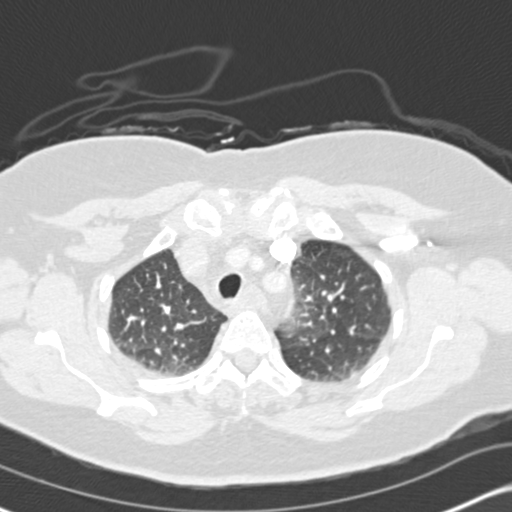
[im 248/301  soft-tissue]
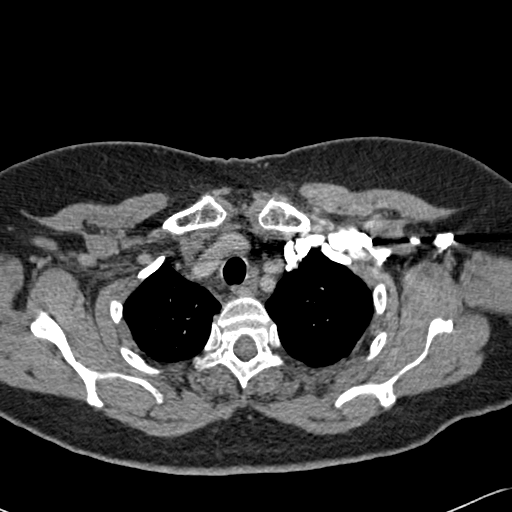
[im 261/301  lung]
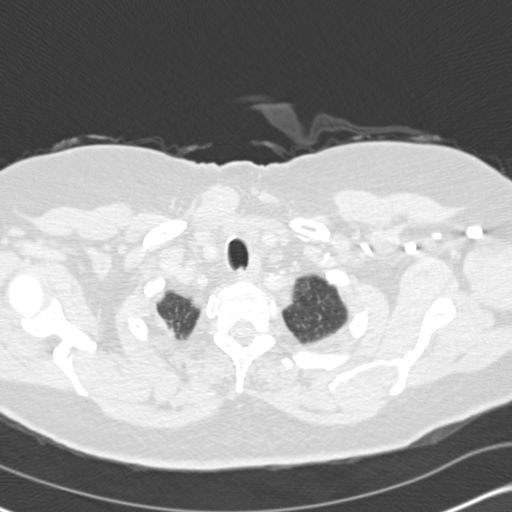
[im 287/301  soft-tissue]
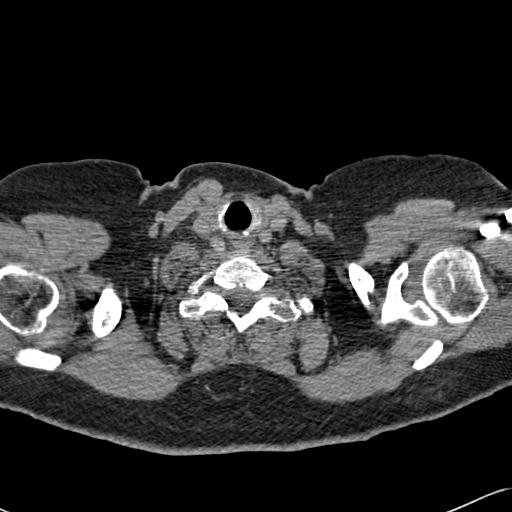

[Series 8: coronals · coronal · 0.63mm/px · 3 of 126 slices shown]
[im 32/126  soft-tissue]
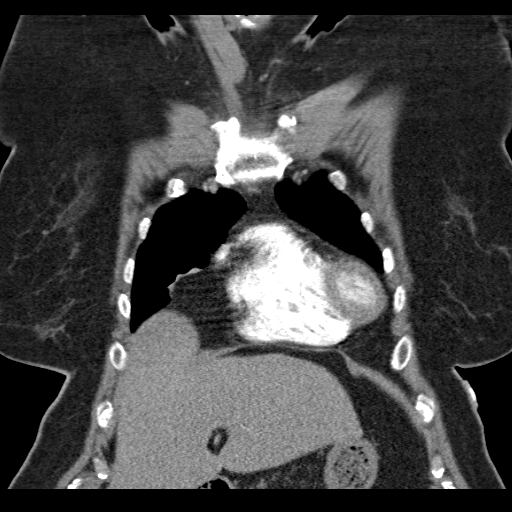
[im 63/126  soft-tissue]
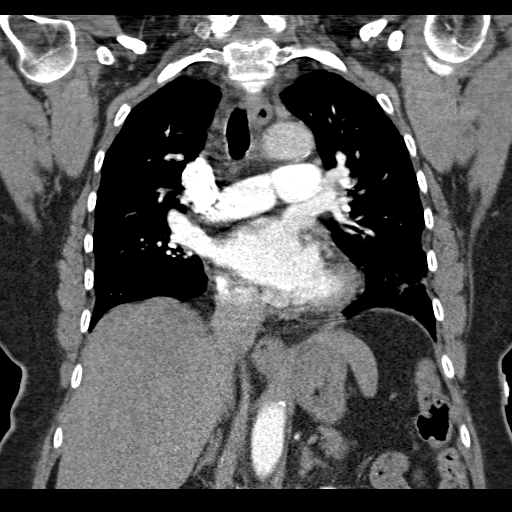
[im 94/126  soft-tissue]
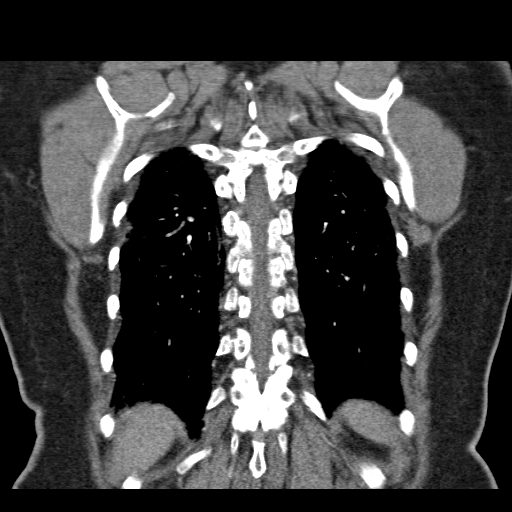

[19 of 46 positions shown; findings below may reference images not displayed]

FINDINGS: Technically adequate study with good opacification of the
central and segmental pulmonary arteries.  No focal filling
defects.  No evidence of significant pulmonary embolus.  Normal
caliber thoracic aorta with calcification.  Normal heart size.
Small esophageal hiatal hernia.  The esophagus is decompressed.  No
significant lymphadenopathy in the chest.  No pleural effusions.
Visualized portions of the upper abdominal organs are unremarkable.
Dependent atelectasis in the lung bases.  No focal consolidation.
No pneumothorax.  No significant interstitial change.  Airways
appear patent.  Respiratory motion artifact limits visualization of
the lung fields.  Degenerative changes in the thoracic spine.
IMPRESSION: No evidence of significant pulmonary embolus.

## 2013-07-18 ENCOUNTER — Encounter: Payer: Self-pay | Admitting: Cardiovascular Disease

## 2013-08-21 ENCOUNTER — Telehealth: Payer: Self-pay | Admitting: Cardiology

## 2013-08-21 NOTE — Telephone Encounter (Signed)
Patient is aware that she is overdue but wants to wait until after she has had her physical with PCP to schedule appointment. Said that she would call to make appt

## 2014-07-24 DIAGNOSIS — E039 Hypothyroidism, unspecified: Secondary | ICD-10-CM | POA: Insufficient documentation

## 2014-08-20 ENCOUNTER — Encounter: Payer: Self-pay | Admitting: Cardiology

## 2014-08-20 ENCOUNTER — Ambulatory Visit (INDEPENDENT_AMBULATORY_CARE_PROVIDER_SITE_OTHER): Payer: Medicare Other | Admitting: Cardiology

## 2014-08-20 VITALS — BP 128/64 | HR 74 | Ht 63.0 in | Wt 187.0 lb

## 2014-08-20 DIAGNOSIS — I10 Essential (primary) hypertension: Secondary | ICD-10-CM

## 2014-08-20 NOTE — Patient Instructions (Signed)
The current medical regimen is effective;  continue present plan and medications.  Follow up as needed 

## 2014-08-20 NOTE — Progress Notes (Signed)
HPI The patient presents for evaluation of chest pain.  I have seen her before for syncope thought to be related to orthostasis.  She has had no further syncope.  She did have an episode of chest pain before Christmas.  This was sharp and mid chest while she was in the grocery store.  She called her daughter who is a Engineer, civil (consulting).  She told her to take TUMS.  Her symptoms resolved after about 25 minutes.  She has had no recurrence of the symptoms since that time.  She doesn't exercise but she does vacuum.  With this she has had no cardiovascular symptoms.  The patient denies any new symptoms such as chest discomfort, neck or arm discomfort. There has been no new shortness of breath, PND or orthopnea. There have been no reported palpitations, presyncope or syncope.   No Known Allergies  Current Outpatient Prescriptions  Medication Sig Dispense Refill  . amLODipine (NORVASC) 5 MG tablet Take 5 mg by mouth daily.    Marland Kitchen aspirin 81 MG tablet Take 81 mg by mouth daily.    . Calcium Carb-Cholecalciferol (RA CALCIUM PLUS VITAMIN D3 PO) Take 1,000 Int'l Units by mouth daily.    . Cholecalciferol (VITAMIN D-3) 1000 UNITS CAPS Take 2,000 Units by mouth daily.    Marland Kitchen levothyroxine (SYNTHROID) 100 MCG tablet Take 1 tablet (100 mcg total) by mouth daily. 30 tablet 0  . losartan (COZAAR) 50 MG tablet Take 1 tablet (50 mg total) by mouth daily. 30 tablet 6   No current facility-administered medications for this visit.    Past Medical History  Diagnosis Date  . Hypertension     new onset   . Hypothyroidism   . Arthritis     spine  . Heart murmur   . Syncope     Past Surgical History  Procedure Laterality Date  . Abdominal hysterectomy    . Tonsillectomy    . Cataract extraction      ROS:  As stated in the HPI and negative for all other systems.  PHYSICAL EXAM BP 128/64 mmHg  Pulse 74  Ht  (1.6 m)  Wt 187 lb (84.823 kg)  BMI 33.13 kg/m2 GENERAL:  Well appearing HEENT:  Pupils equal round  and reactive, fundi not visualized, oral mucosa unremarkable NECK:  No jugular venous distention, waveform within normal limits, carotid upstroke brisk and symmetric, left carotid bruits, no thyromegaly LYMPHATICS:  No cervical, inguinal adenopathy LUNGS:  Clear to auscultation bilaterally BACK:  No CVA tenderness CHEST:  Unremarkable HEART:  PMI not displaced or sustained,S1 and S2 within normal limits, no S3, no S4, no clicks, no rubs, no murmurs ABD:  Flat, positive bowel sounds normal in frequency in pitch, no bruits, no rebound, no guarding, no midline pulsatile mass, no hepatomegaly, no splenomegaly EXT:  2 plus pulses throughout, no edema, no cyanosis no clubbing SKIN:  No rashes no nodules NEURO:  Cranial nerves II through XII grossly intact, motor grossly intact throughout PSYCH:  Cognitively intact, oriented to person place and time  EKG:  Sinus rhythm, rate 74, axis within normal limits, intervals within normal limits, no acute ST-T wave changes.  ASSESSMENT AND PLAN   Chest pain - This was atypical.  She has had no recurrence of this.  No change in therapy is indicated.  Syncope and collapse -  She has had no further LOC.  No further work up is indicated.   HTN (hypertension) - The blood pressure is at  target. No change in medications is indicated. We will continue with therapeutic lifestyle changes (TLC).  Of note:  I reviewed Dr. Adele Danickson's notes and other office notes.  Her problem list continued to report carotid stenosis.  However, Dr. Edilia Boickson does not think that she has any evidence of this on repeat Doppler.  Also her problem list included erroneous diagnosis.  I corrected this and removed carotid stenosis.

## 2017-01-13 ENCOUNTER — Telehealth: Payer: Self-pay | Admitting: Hematology

## 2017-01-13 ENCOUNTER — Encounter: Payer: Self-pay | Admitting: Hematology

## 2017-01-13 NOTE — Telephone Encounter (Signed)
Spoke to [place] from Dr. Sherene SiresWainer's office to schedule an appt for the pt to see Dr. Mosetta PuttFeng on 7/2 at 11am. She will notify the pt. Letter mailed.

## 2017-01-19 ENCOUNTER — Ambulatory Visit (INDEPENDENT_AMBULATORY_CARE_PROVIDER_SITE_OTHER): Payer: Medicare HMO | Admitting: Physician Assistant

## 2017-01-19 ENCOUNTER — Encounter (INDEPENDENT_AMBULATORY_CARE_PROVIDER_SITE_OTHER): Payer: Self-pay | Admitting: Physician Assistant

## 2017-01-19 DIAGNOSIS — M1712 Unilateral primary osteoarthritis, left knee: Secondary | ICD-10-CM

## 2017-01-19 NOTE — Progress Notes (Signed)
Office Visit Note   Patient: Claudia Rocha           Date of Birth: 11/16/1940           MRN: [id number] Visit Date: 01/19/2017              Requested by: Vivien Presto, MD [phone] [place], [place] [address] PCP: Vivien Presto, MD   Assessment & Plan: Visit Diagnoses:  1. Primary osteoarthritis of left knee     Plan: After long discussion with patient and her husband  about her left knee MRI findings . Also based on her continued knee pain despite conservative treatment which has include cortisone and supplemental injections. We would recommend a total knee arthroplasty due to her continued left knee pain that is efecting her quality of life . She would like to undergo a left total knee arthroplasty sometime in early  fall. Therefore will see her back in late July and obtain AP and lateral views of her left knee at that time. Questions were encouraged and answered at length today.  Follow-Up Instructions: Return in about 6 weeks (around 03/02/2017) for Dr. Magnus Ivan.   Orders:  No orders of the defined types were placed in this encounter.  No orders of the defined types were placed in this encounter.     Procedures: No procedures performed   Clinical Data: No additional findings.   Subjective: Chief Complaint  Patient presents with  . Left Knee - Pain    HPI Claudia Rocha comes in today with left knee pain that's been ongoing for the past 2 years. She states pain is becoming progressively worse. She's had no injury. No mechanical symptoms. She rates her pain to be 5 out of 10 pain most of the time. She does not use any assistive device to ambulate. Has difficulty with steps and getting up from a seated position. She's had cortisone injections in the knee and he flexes injections in the knee despite this continues to have pain in the knee. She underwent an MRI of her knee on 01/06/2017 which is ordered by another physician. She doesn't bring with her the  results of the MRI today. MRI shows a comminuted tear of the medial meniscus and a discoid lateral meniscus. Small joint effusion. 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 No acute fractures. Patient is diabetic and reports hemoglobin A1c to be around 6.2. She has a history of pulmonary embolism in March 2016 she states she was doing a lot of traveling that point in time. She is currently on aspirin and no other anticoagulants.  Review of Systems Denies fevers chills shortness breath or chest pain. Please see history of present illness otherwise  Objective: Vital Signs: There were no vitals taken for this visit.  Physical Exam  Constitutional: She is oriented to person, place, and time. She appears well-developed and well-nourished. No distress.  HENT:  Head: Normocephalic and atraumatic.  Eyes: EOM are normal.  Pulmonary/Chest: Effort normal.  Neurological: She is alert and oriented to person, place, and time.  Psychiatric: She has a normal mood and affect. Her behavior is normal.    Ortho Exam Right knee 0-115. Left knee 0-105. She has no tenderness about the  right knee. Left knee tenderness medial joint line. Left knee with positive McMurray's. Slight effusion left knee. No effusion abnormal warmth erythema of the right knee. Left knee without abnormal warmth or  erythema. Specialty Comments:  No specialty comments available.  Imaging: No results found.   PMFS History: Patient Active Problem List   Diagnosis Date Noted  . Pounding in head 04/23/2012  . Hypersomnolence 04/23/2012  . Syncope and collapse 12/27/2011  . HTN (hypertension) 12/27/2011   Past Medical History:  Diagnosis Date  . Arthritis    spine  . Heart murmur   . Hypertension    new onset   . Hypothyroidism   . Syncope     No family history on file.  Past Surgical History:  Procedure Laterality Date  . ABDOMINAL HYSTERECTOMY    .  CATARACT EXTRACTION    . TONSILLECTOMY     Social History   Occupational History  . Not on file.   Social History Main Topics  . Smoking status: Never Smoker  . Smokeless tobacco: Never Used  . Alcohol use No  . Drug use: Unknown  . Sexual activity: Not Currently    Birth control/ protection: Post-menopausal

## 2017-02-01 ENCOUNTER — Telehealth: Payer: Self-pay | Admitting: Hematology

## 2017-02-01 NOTE — Telephone Encounter (Signed)
Patient husband called and cxd 7/2 new patient hematology appointment with Dr. Mosetta PuttFeng due to other conflicts. Per husband they will call back to reschedule and will also inform the referring office that they have cxd.

## 2017-02-13 ENCOUNTER — Encounter: Payer: Medicare Other | Admitting: Hematology

## 2017-03-13 ENCOUNTER — Ambulatory Visit (INDEPENDENT_AMBULATORY_CARE_PROVIDER_SITE_OTHER): Payer: Medicare HMO | Admitting: Physician Assistant

## 2017-03-13 ENCOUNTER — Ambulatory Visit (INDEPENDENT_AMBULATORY_CARE_PROVIDER_SITE_OTHER): Payer: Medicare HMO

## 2017-03-13 ENCOUNTER — Encounter (INDEPENDENT_AMBULATORY_CARE_PROVIDER_SITE_OTHER): Payer: Self-pay | Admitting: Physician Assistant

## 2017-03-13 DIAGNOSIS — M1712 Unilateral primary osteoarthritis, left knee: Secondary | ICD-10-CM

## 2017-03-13 DIAGNOSIS — M25561 Pain in right knee: Secondary | ICD-10-CM

## 2017-03-13 DIAGNOSIS — M170 Bilateral primary osteoarthritis of knee: Secondary | ICD-10-CM | POA: Diagnosis not present

## 2017-03-13 MED ORDER — METHYLPREDNISOLONE ACETATE 40 MG/ML IJ SUSP
40.0000 mg | INTRAMUSCULAR | Status: AC | PRN
  Administered 2017-03-13: 40 mg via INTRA_ARTICULAR

## 2017-03-13 MED ORDER — LIDOCAINE HCL 1 % IJ SOLN
3.0000 mL | INTRAMUSCULAR | Status: AC | PRN
  Administered 2017-03-13: 3 mL

## 2017-03-13 NOTE — Progress Notes (Signed)
Office Visit Note   Patient: Claudia Rocha           Date of Birth: 10-14-1940           MRN: [id number] Visit Date: 03/13/2017              Requested by: Vivien Prestoorrington, Kip A, MD [phone] [address] [place], [place] [address] PCP: Vivien Prestoorrington, Kip A, MD   Assessment & Plan: Visit Diagnoses:  1. Primary osteoarthritis of both knees   2. Acute pain of right knee     Plan: Due to patient's failure of conservative treatment in regards to left knee she would like to proceed with a left total knee arthroplasty in the near future. Pain in left knee is affecting her activities of daily living. She actually has contemplated abutting the elevator in her home due to the fact that she has trouble going up and down her her stairs. We will place her on several toe postop due to the fact that she has a history of pulmonary embolism in the past. Risk including but not limited to PE/DVT, prolonged pain, worsening pain, wound infection, nerve or vessel injury all discussed. Knee model was shown to the patient by Dr. Magnus IvanBlackman. We'll see her back 2 weeks postop.  Follow-Up Instructions: Return in about 2 weeks (around 03/27/2017) for post op.   Orders:  Orders Placed This Encounter  Procedures  . Large Joint Injection/Arthrocentesis  . XR Knee 1-2 Views Right  . XR Knee 1-2 Views Left   No orders of the defined types were placed in this encounter.     Procedures: Large Joint Inj Date/Time: 03/13/2017 11:46 AM Performed by: Kirtland BouchardLARK, Govind Furey W Authorized by: Kirtland BouchardLARK, Reilly Blades W   Consent Given by:  Patient Indications:  Pain Location:  Knee Site:  R knee Needle Size:  25 G Approach:  Anterolateral Ultrasound Guidance: No   Fluoroscopic Guidance: No   Medications:  40 mg methylPREDNISolone acetate 40 MG/ML; 3 mL lidocaine 1 % Aspiration Attempted: No   Patient tolerance:  Patient tolerated the procedure well with no immediate complications     Clinical Data: No additional  findings.   Subjective: Chief Complaint  Patient presents with  . Left Knee - Pain  . Right Knee - Pain    HPI Mrs. Claudia Rocha returns today to discuss left total knee arthroplasty. She also is complaining of right knee pain is becoming worse than left over the last 3 months. Questioning if this is due to the fact she's favoring the left knee putting more pressure on the right. She's had no known injury to the right knee. Left knee she's failed conservative treatment which included its steroid injection today and supplemental injections. She's having difficulty getting around her home due to the fact that she has problems with stairs. She actually had have a special tub placed in her home due to the fact she is unable to lift her leg up for enough to get in and out of the tub. She is well Controlled diabetic with a hemoglobin of 6.2 last check. She also has a history of PE 2 years ago and is currently on an 81 mg aspirin twice daily.   Review of Systems No fevers chills shortness breath chest pain Pain. Otherwise see history of present illness.  Objective: Vital Signs: There were no vitals taken for this visit.  Physical Exam  Constitutional: She is oriented to person, place, and time. She appears well-developed and well-nourished.  No distress.  Neurological: She is alert and oriented to person, place, and time.  Skin: She is not diaphoretic.  Psychiatric: She has a normal mood and affect. Her behavior is normal.    Ortho Exam Bilateral knees no effusion abnormal warmth or erythema. Fluid range of motion of both knees. No instability valgus varus stressing. Tenderness over the medial joint line of both knees  Specialty Comments:  No specialty comments available.  Imaging: Xr Knee 1-2 Views Left  Result Date: 03/13/2017 AP lateral left knee: Tricompartmental changes with moderate severe medial compartmental arthritis moderate patellofemoral arthritis. No acute fractures no bony  abnormalities otherwise.  Xr Knee 1-2 Views Right  Result Date: 03/13/2017 Tricompartmental changes with moderate severe medial compartmental arthritis moderate patellofemoral arthritis. Osteophytes off the lateral compartment both femur and tibia. No acute fractures no bony abnormalities otherwise.     PMFS History: Patient Active Problem List   Diagnosis Date Noted  . Pounding in head 04/23/2012  . Hypersomnolence 04/23/2012  . Syncope and collapse 12/27/2011  . HTN (hypertension) 12/27/2011   Past Medical History:  Diagnosis Date  . Arthritis    spine  . Heart murmur   . Hypertension    new onset   . Hypothyroidism   . Syncope     No family history on file.  Past Surgical History:  Procedure Laterality Date  . ABDOMINAL HYSTERECTOMY    . CATARACT EXTRACTION    . TONSILLECTOMY     Social History   Occupational History  . Not on file.   Social History Main Topics  . Smoking status: Never Smoker  . Smokeless tobacco: Never Used  . Alcohol use No  . Drug use: Unknown  . Sexual activity: Not Currently    Birth control/ protection: Post-menopausal

## 2017-03-17 ENCOUNTER — Other Ambulatory Visit (INDEPENDENT_AMBULATORY_CARE_PROVIDER_SITE_OTHER): Payer: Self-pay | Admitting: Orthopaedic Surgery

## 2017-03-17 DIAGNOSIS — M1712 Unilateral primary osteoarthritis, left knee: Secondary | ICD-10-CM

## 2017-03-21 ENCOUNTER — Other Ambulatory Visit (INDEPENDENT_AMBULATORY_CARE_PROVIDER_SITE_OTHER): Payer: Self-pay | Admitting: Orthopaedic Surgery

## 2017-03-29 NOTE — Pre-Procedure Instructions (Signed)
Claudia Rocha  03/29/2017      [facility] - [place], [place] - [address] [facility] [place] [address] Phone: [phone] Fax: [phone]  [facility] [address] [place] [address] Phone: [phone] Fax: [phone]  [facility] [address] [place] - [address] [place] [address] [place] [address] Phone: [phone] Fax: [phone]    Your procedure is scheduled on Tuesday August 21.  Report to [facility] Admitting at 1:00 PM.  Call this number if you have problems the morning of surgery:  [phone]   Remember:  Do not eat food or drink liquids after midnight.  Take these medicines the morning of surgery with A SIP OF WATER: levothyroxine (synthroid)  7 days prior to surgery STOP taking any Aspirin, Aleve, Naproxen, Ibuprofen, Motrin, Advil, Goody's, BC's, all herbal medications, fish oil, and all vitamins  WHAT DO I DO ABOUT MY DIABETES MEDICATION?   Marland Kitchen Do not take oral diabetes medicines (pills) the morning of surgery. DO NOT TAKE sitagliptin (Januvia) the day of surgery   How to Manage Your Diabetes Before and After Surgery  Why is it important to control my blood sugar before and after surgery? . Improving blood sugar levels before and after surgery helps healing and can limit problems. . A way of improving blood sugar control is eating a healthy diet by: o  Eating less sugar and carbohydrates o  Increasing activity/exercise o  Talking with your doctor about reaching your blood sugar goals . High blood sugars (greater than 180 mg/dL) can raise your risk of infections and slow your recovery, so you will need to focus on controlling your diabetes during the weeks before surgery. . Make sure that the doctor who takes care of your diabetes knows about your planned surgery including the date and location.  How do I manage my blood sugar before surgery? . Check your blood sugar  at least 4 times a day, starting 2 days before surgery, to make sure that the level is not too high or low. o Check your blood sugar the morning of your surgery when you wake up and every 2 hours until you get to the Short Stay unit. . If your blood sugar is less than 70 mg/dL, you will need to treat for low blood sugar: o Do not take insulin. o Treat a low blood sugar (less than 70 mg/dL) with  cup of clear juice (cranberry or apple), 4 glucose tablets, OR glucose gel. o Recheck blood sugar in 15 minutes after treatment (to make sure it is greater than 70 mg/dL). If your blood sugar is not greater than 70 mg/dL on recheck, call [phone] for further instructions. . Report your blood sugar to the short stay nurse when you get to Short Stay.  . If you are admitted to the hospital after surgery: o Your blood sugar will be checked by the staff and you will probably be given insulin after surgery (instead of oral diabetes medicines) to make sure you have good blood sugar levels. o The goal for blood sugar control after surgery is 80-180 mg/dL.               Do not wear jewelry, make-up or nail polish.  Do not wear lotions, powders, or perfumes, or deoderant.  Do not shave 48 hours prior to surgery.  Men may shave face and neck.  Do not bring valuables to  the hospital.  [facility] is not responsible for any belongings or valuables.  Contacts, dentures or bridgework may not be worn into surgery.  Leave your suitcase in the car.  After surgery it may be brought to your room.  For patients admitted to the hospital, discharge time will be determined by your treatment team.  Patients discharged the day of surgery will not be allowed to drive home.   Special instructions:    [place]- Preparing For Surgery  Before surgery, you can play an important role. Because skin is not sterile, your skin needs to be as free of germs as possible. You can reduce the number of germs on your skin  by washing with CHG (chlorahexidine gluconate) Soap before surgery.  CHG is an antiseptic cleaner which kills germs and bonds with the skin to continue killing germs even after washing.  Please do not use if you have an allergy to CHG or antibacterial soaps. If your skin becomes reddened/irritated stop using the CHG.  Do not shave (including legs and underarms) for at least 48 hours prior to first CHG shower. It is OK to shave your face.  Please follow these instructions carefully.   1. Shower the NIGHT BEFORE SURGERY and the MORNING OF SURGERY with CHG.   2. If you chose to wash your hair, wash your hair first as usual with your normal shampoo.  3. After you shampoo, rinse your hair and body thoroughly to remove the shampoo.  4. Use CHG as you would any other liquid soap. You can apply CHG directly to the skin and wash gently with a scrungie or a clean washcloth.   5. Apply the CHG Soap to your body ONLY FROM THE NECK DOWN.  Do not use on open wounds or open sores. Avoid contact with your eyes, ears, mouth and genitals (private parts). Wash genitals (private parts) with your normal soap.  6. Wash thoroughly, paying special attention to the area where your surgery will be performed.  7. Thoroughly rinse your body with warm water from the neck down.  8. DO NOT shower/wash with your normal soap after using and rinsing off the CHG Soap.  9. Pat yourself dry with a CLEAN TOWEL.   10. Wear CLEAN PAJAMAS   11. Place CLEAN SHEETS on your bed the night of your first shower and DO NOT SLEEP WITH PETS.    Day of Surgery: Do not apply any deodorants/lotions. Please wear clean clothes to the hospital/surgery center.      Please read over the following fact sheets that you were given. MRSA Information

## 2017-03-29 NOTE — Pre-Procedure Instructions (Signed)
Claudia Rocha  03/29/2017      [facility] - [place], [place] - [address] [facility] [place] [address] Phone: [phone] Fax: [phone]  [facility] [address] [place] [address] Phone: [phone] Fax: [phone]  [facility] [address], [place] - [address] [place] [address] [place] [address] Phone: [phone] Fax: [phone]    Your procedure is scheduled on Aug 21  Report to [facility] Admitting at 100 pm   Call this number if you have problems the morning of surgery:  [phone]   Remember:  Do not eat food or drink liquids after midnight.  Take these medicines the morning of surgery with A SIP OF WATER Levothyroxine (Synthroid) Stop taking Aspirin as directed by your Dr.     Yevonne Pax to Manage Your Diabetes Before and After Surgery  Why is it important to control my blood sugar before and after surgery? . Improving blood sugar levels before and after surgery helps healing and can limit problems. . A way of improving blood sugar control is eating a healthy diet by: o  Eating less sugar and carbohydrates o  Increasing activity/exercise o  Talking with your doctor about reaching your blood sugar goals . High blood sugars (greater than 180 mg/dL) can raise your risk of infections and slow your recovery, so you will need to focus on controlling your diabetes during the weeks before surgery. . Make sure that the doctor who takes care of your diabetes knows about your planned surgery including the date and location.  How do I manage my blood sugar before surgery? . Check your blood sugar at least 4 times a day, starting 2 days before surgery, to make sure that the level is not too high or low. o Check your blood sugar the morning of your surgery when you wake up and every 2 hours until you get to the Short Stay unit. . If your blood sugar is less than 70 mg/dL, you will need to  treat for low blood sugar: o Do not take insulin. o Treat a low blood sugar (less than 70 mg/dL) with  cup of clear juice (cranberry or apple), 4 glucose tablets, OR glucose gel. o Recheck blood sugar in 15 minutes after treatment (to make sure it is greater than 70 mg/dL). If your blood sugar is not greater than 70 mg/dL on recheck, call [phone] for further instructions. . Report your blood sugar to the short stay nurse when you get to Short Stay.  . If you are admitted to the hospital after surgery: o Your blood sugar will be checked by the staff and you will probably be given insulin after surgery (instead of oral diabetes medicines) to make sure you have good blood sugar levels. o The goal for blood sugar control after surgery is 80-180 mg/dL      WHAT DO I DO ABOUT MY DIABETES MEDICATION?   Marland Kitchen Do not take oral diabetes medicines (pills) the morning of surgery. Sitagliptin (Januvia)  . THE NIGHT BEFORE SURGERY, take ___________ units of ___________insulin.       Marland Kitchen HE MORNING OF SURGERY, take _____________ units of __________insulin.  . The day of surgery, do not take other diabetes injectables, including Byetta (exenatide), Bydureon (exenatide ER), Victoza (liraglutide), or Trulicity (dulaglutide).  . If your CBG is greater than 220 mg/dL, you may take  of your sliding scale (correction) dose of insulin.  Other Instructions:          Patient Signature:  Date:   Nurse Signature:  Date:   Reviewed and Endorsed by [facility] Patient Education Committee, August 2015  Do not wear jewelry, make-up or nail polish.  Do not wear lotions, powders, or perfumes, or deoderant.  Do not shave 48 hours prior to surgery.  Men may shave face and neck.  Do not bring valuables to the hospital.  [facility] is not responsible for any belongings or valuables.  Contacts, dentures or bridgework may not be worn into surgery.  Leave your suitcase in the car.  After surgery it may be  brought to your room.  For patients admitted to the hospital, discharge time will be determined by your treatment team.  Patients discharged the day of surgery will not be allowed to drive home.    Special instructions:  [place] - Preparing for Surgery  Before surgery, you can play an important role.  Because skin is not sterile, your skin needs to be as free of germs as possible.  You can reduce the number of germs on you skin by washing with CHG (chlorahexidine gluconate) soap before surgery.  CHG is an antiseptic cleaner which kills germs and bonds with the skin to continue killing germs even after washing.  Please DO NOT use if you have an allergy to CHG or antibacterial soaps.  If your skin becomes reddened/irritated stop using the CHG and inform your nurse when you arrive at Short Stay.  Do not shave (including legs and underarms) for at least 48 hours prior to the first CHG shower.  You may shave your face.  Please follow these instructions carefully:   1.  Shower with CHG Soap the night before surgery and the                                morning of Surgery.  2.  If you choose to wash your hair, wash your hair first as usual with your       normal shampoo.  3.  After you shampoo, rinse your hair and body thoroughly to remove the                      Shampoo.  4.  Use CHG as you would any other liquid soap.  You can apply chg directly       to the skin and wash gently with scrungie or a clean washcloth.  5.  Apply the CHG Soap to your body ONLY FROM THE NECK DOWN.        Do not use on open wounds or open sores.  Avoid contact with your eyes,       ears, mouth and genitals (private parts).  Wash genitals (private parts)       with your normal soap.  6.  Wash thoroughly, paying special attention to the area where your surgery        will be performed.  7.  Thoroughly rinse your body with warm water from the neck down.  8.  DO NOT shower/wash with your normal soap after using and  rinsing off       the CHG Soap.  9.  Pat yourself dry with a clean towel.            10.  Wear clean pajamas.  11.  Place clean sheets on your bed the night of your first shower and do not        sleep with pets.  Day of Surgery  Do not apply any lotions/deoderants the morning of surgery.  Please wear clean clothes to the hospital/surgery center.     Please read over the following fact sheets that you were given. Pain Booklet, Coughing and Deep Breathing, MRSA Information and Surgical Site Infection Prevention

## 2017-03-30 ENCOUNTER — Encounter (HOSPITAL_COMMUNITY)
Admission: RE | Admit: 2017-03-30 | Discharge: 2017-03-30 | Disposition: A | Discharge status: Home / Self Care | Payer: Medicare HMO | Source: Ambulatory Visit | Attending: Orthopaedic Surgery | Admitting: Orthopaedic Surgery

## 2017-03-30 ENCOUNTER — Encounter (HOSPITAL_COMMUNITY): Payer: Self-pay | Admitting: Urology

## 2017-03-30 DIAGNOSIS — I10 Essential (primary) hypertension: Secondary | ICD-10-CM | POA: Insufficient documentation

## 2017-03-30 DIAGNOSIS — Z01812 Encounter for preprocedural laboratory examination: Secondary | ICD-10-CM | POA: Diagnosis present

## 2017-03-30 DIAGNOSIS — M1712 Unilateral primary osteoarthritis, left knee: Secondary | ICD-10-CM | POA: Insufficient documentation

## 2017-03-30 HISTORY — DX: Other pulmonary embolism without acute cor pulmonale: I26.99

## 2017-03-30 HISTORY — DX: Type 2 diabetes mellitus without complications: E11.9

## 2017-03-30 LAB — GLUCOSE, CAPILLARY: Glucose-Capillary: 96 mg/dL (ref 65–99)

## 2017-03-30 LAB — SURGICAL PCR SCREEN
MRSA, PCR: NEGATIVE
STAPHYLOCOCCUS AUREUS: NEGATIVE

## 2017-03-30 NOTE — Progress Notes (Signed)
PCP - Kip Corrington Cardiologist - none  EKG - 03/29/17 per patient, requested from PCP ECHO - 12/27/11  Fasting Blood Sugar - normally in 90s per patient, Hgb A1c 6.0 in care everywhere  Patient denies shortness of breath, fever, cough and chest pain at PAT appointment   Patient verbalized understanding of instructions that were given to them at the PAT appointment. Patient was also instructed that they will need to review over the PAT instructions again at home before surgery.

## 2017-04-03 MED ORDER — CEFAZOLIN SODIUM-DEXTROSE 2-4 GM/100ML-% IV SOLN
2.0000 g | INTRAVENOUS | Status: AC
  Administered 2017-04-04: 2 g via INTRAVENOUS
  Filled 2017-04-03: qty 100

## 2017-04-03 NOTE — Progress Notes (Signed)
Pt returned phone call verifying time change. Pt to arrive at 11:45.

## 2017-04-04 ENCOUNTER — Inpatient Hospital Stay (HOSPITAL_COMMUNITY)
Admission: AD | Admit: 2017-04-04 | Discharge: 2017-04-07 | DRG: 470 | Disposition: A | Discharge status: Home Health | Payer: Medicare HMO | Source: Ambulatory Visit | Attending: Orthopaedic Surgery | Admitting: Orthopaedic Surgery

## 2017-04-04 ENCOUNTER — Encounter (HOSPITAL_COMMUNITY)
Admission: AD | Disposition: A | Discharge status: Home Health | Payer: Self-pay | Source: Ambulatory Visit | Attending: Orthopaedic Surgery

## 2017-04-04 ENCOUNTER — Inpatient Hospital Stay (HOSPITAL_COMMUNITY): Payer: Medicare HMO

## 2017-04-04 ENCOUNTER — Ambulatory Visit (HOSPITAL_COMMUNITY): Payer: Medicare HMO | Admitting: Anesthesiology

## 2017-04-04 ENCOUNTER — Encounter (HOSPITAL_COMMUNITY): Payer: Self-pay | Admitting: *Deleted

## 2017-04-04 DIAGNOSIS — I10 Essential (primary) hypertension: Secondary | ICD-10-CM | POA: Diagnosis present

## 2017-04-04 DIAGNOSIS — M25762 Osteophyte, left knee: Secondary | ICD-10-CM | POA: Diagnosis present

## 2017-04-04 DIAGNOSIS — M25562 Pain in left knee: Secondary | ICD-10-CM | POA: Diagnosis present

## 2017-04-04 DIAGNOSIS — E1151 Type 2 diabetes mellitus with diabetic peripheral angiopathy without gangrene: Secondary | ICD-10-CM | POA: Diagnosis present

## 2017-04-04 DIAGNOSIS — Z86711 Personal history of pulmonary embolism: Secondary | ICD-10-CM

## 2017-04-04 DIAGNOSIS — E039 Hypothyroidism, unspecified: Secondary | ICD-10-CM | POA: Diagnosis present

## 2017-04-04 DIAGNOSIS — Z90710 Acquired absence of both cervix and uterus: Secondary | ICD-10-CM

## 2017-04-04 DIAGNOSIS — Z96652 Presence of left artificial knee joint: Secondary | ICD-10-CM

## 2017-04-04 DIAGNOSIS — D62 Acute posthemorrhagic anemia: Secondary | ICD-10-CM | POA: Diagnosis not present

## 2017-04-04 DIAGNOSIS — Z888 Allergy status to other drugs, medicaments and biological substances status: Secondary | ICD-10-CM | POA: Diagnosis not present

## 2017-04-04 DIAGNOSIS — M1712 Unilateral primary osteoarthritis, left knee: Secondary | ICD-10-CM | POA: Diagnosis present

## 2017-04-04 HISTORY — PX: TOTAL KNEE ARTHROPLASTY: SHX125

## 2017-04-04 LAB — GLUCOSE, CAPILLARY
GLUCOSE-CAPILLARY: 91 mg/dL (ref 65–99)
GLUCOSE-CAPILLARY: 72 mg/dL (ref 65–99)

## 2017-04-04 SURGERY — ARTHROPLASTY, KNEE, TOTAL
Anesthesia: Spinal | Site: Knee | Laterality: Left

## 2017-04-04 MED ORDER — 0.9 % SODIUM CHLORIDE (POUR BTL) OPTIME
TOPICAL | Status: DC | PRN
  Administered 2017-04-04: 1000 mL

## 2017-04-04 MED ORDER — CEFAZOLIN SODIUM-DEXTROSE 1-4 GM/50ML-% IV SOLN
1.0000 g | Freq: Four times a day (QID) | INTRAVENOUS | Status: AC
  Administered 2017-04-04 – 2017-04-05 (×2): 1 g via INTRAVENOUS
  Filled 2017-04-04 (×2): qty 50

## 2017-04-04 MED ORDER — ACETAMINOPHEN 325 MG PO TABS
650.0000 mg | ORAL_TABLET | Freq: Four times a day (QID) | ORAL | Status: DC | PRN
  Administered 2017-04-05: 650 mg via ORAL
  Administered 2017-04-05: 325 mg via ORAL
  Administered 2017-04-06 – 2017-04-07 (×4): 650 mg via ORAL
  Filled 2017-04-04 (×7): qty 2

## 2017-04-04 MED ORDER — OXYCODONE HCL 5 MG/5ML PO SOLN: 5.0000 mg | Freq: Once | ORAL | Status: DC | PRN

## 2017-04-04 MED ORDER — METHOCARBAMOL 500 MG PO TABS
ORAL_TABLET | ORAL | Status: AC
  Administered 2017-04-04: 500 mg via ORAL
  Filled 2017-04-04: qty 1

## 2017-04-04 MED ORDER — MENTHOL 3 MG MT LOZG: 1.0000 | LOZENGE | OROMUCOSAL | Status: DC | PRN

## 2017-04-04 MED ORDER — OXYCODONE HCL 5 MG PO TABS
ORAL_TABLET | ORAL | Status: AC
  Administered 2017-04-04: 10 mg via ORAL
  Filled 2017-04-04: qty 2

## 2017-04-04 MED ORDER — METOCLOPRAMIDE HCL 5 MG/ML IJ SOLN: 5.0000 mg | Freq: Three times a day (TID) | INTRAMUSCULAR | Status: DC | PRN

## 2017-04-04 MED ORDER — OXYCODONE HCL 5 MG PO TABS: 5.0000 mg | ORAL_TABLET | Freq: Once | ORAL | Status: DC | PRN

## 2017-04-04 MED ORDER — LIDOCAINE 2% (20 MG/ML) 5 ML SYRINGE
INTRAMUSCULAR | Status: DC | PRN
  Administered 2017-04-04: 40 mg via INTRAVENOUS

## 2017-04-04 MED ORDER — HYDROMORPHONE HCL 1 MG/ML IJ SOLN
0.2500 mg | INTRAMUSCULAR | Status: DC | PRN
  Administered 2017-04-04 (×4): 0.5 mg via INTRAVENOUS

## 2017-04-04 MED ORDER — BUPIVACAINE HCL (PF) 0.75 % IJ SOLN
INTRAMUSCULAR | Status: DC | PRN
  Administered 2017-04-04: 1.6 mL via INTRATHECAL

## 2017-04-04 MED ORDER — OXYCODONE HCL 5 MG PO TABS
5.0000 mg | ORAL_TABLET | ORAL | Status: DC | PRN
  Administered 2017-04-04: 10 mg via ORAL
  Administered 2017-04-05 (×2): 5 mg via ORAL
  Administered 2017-04-05 (×3): 10 mg via ORAL
  Administered 2017-04-05: 5 mg via ORAL
  Administered 2017-04-06: 10 mg via ORAL
  Filled 2017-04-04: qty 1
  Filled 2017-04-04 (×2): qty 2
  Filled 2017-04-04: qty 1
  Filled 2017-04-04 (×4): qty 2

## 2017-04-04 MED ORDER — LIDOCAINE 2% (20 MG/ML) 5 ML SYRINGE
INTRAMUSCULAR | Status: AC
  Filled 2017-04-04: qty 5

## 2017-04-04 MED ORDER — SODIUM CHLORIDE 0.9 % IV SOLN
INTRAVENOUS | Status: DC
  Administered 2017-04-04 – 2017-04-05 (×2): via INTRAVENOUS

## 2017-04-04 MED ORDER — HYDROMORPHONE HCL 1 MG/ML IJ SOLN
INTRAMUSCULAR | Status: AC
  Administered 2017-04-04: 0.5 mg via INTRAVENOUS
  Filled 2017-04-04: qty 1

## 2017-04-04 MED ORDER — PHENOL 1.4 % MT LIQD: 1.0000 | OROMUCOSAL | Status: DC | PRN

## 2017-04-04 MED ORDER — LOSARTAN POTASSIUM 50 MG PO TABS
50.0000 mg | ORAL_TABLET | Freq: Every day | ORAL | Status: DC
  Administered 2017-04-04 – 2017-04-07 (×4): 50 mg via ORAL
  Filled 2017-04-04 (×4): qty 1

## 2017-04-04 MED ORDER — DOCUSATE SODIUM 100 MG PO CAPS
100.0000 mg | ORAL_CAPSULE | Freq: Two times a day (BID) | ORAL | Status: DC
  Administered 2017-04-04 – 2017-04-07 (×6): 100 mg via ORAL
  Filled 2017-04-04 (×6): qty 1

## 2017-04-04 MED ORDER — ONDANSETRON HCL 4 MG PO TABS: 4.0000 mg | ORAL_TABLET | Freq: Four times a day (QID) | ORAL | Status: DC | PRN

## 2017-04-04 MED ORDER — DIPHENHYDRAMINE HCL 12.5 MG/5ML PO ELIX: 12.5000 mg | ORAL_SOLUTION | ORAL | Status: DC | PRN

## 2017-04-04 MED ORDER — LINAGLIPTIN 5 MG PO TABS
5.0000 mg | ORAL_TABLET | Freq: Every day | ORAL | Status: DC
  Administered 2017-04-04 – 2017-04-06 (×3): 5 mg via ORAL
  Filled 2017-04-04 (×3): qty 1

## 2017-04-04 MED ORDER — LACTATED RINGERS IV SOLN
INTRAVENOUS | Status: DC | PRN
  Administered 2017-04-04 (×2): via INTRAVENOUS

## 2017-04-04 MED ORDER — POLYETHYLENE GLYCOL 3350 17 G PO PACK
17.0000 g | PACK | Freq: Every day | ORAL | Status: DC | PRN
  Administered 2017-04-05 – 2017-04-07 (×3): 17 g via ORAL
  Filled 2017-04-04 (×3): qty 1

## 2017-04-04 MED ORDER — RIVAROXABAN 10 MG PO TABS
10.0000 mg | ORAL_TABLET | Freq: Every day | ORAL | Status: DC
  Administered 2017-04-05 – 2017-04-07 (×3): 10 mg via ORAL
  Filled 2017-04-04 (×3): qty 1

## 2017-04-04 MED ORDER — LEVOTHYROXINE SODIUM 100 MCG PO TABS
100.0000 ug | ORAL_TABLET | Freq: Every day | ORAL | Status: DC
  Administered 2017-04-05 – 2017-04-07 (×4): 100 ug via ORAL
  Filled 2017-04-04 (×5): qty 1

## 2017-04-04 MED ORDER — PROPOFOL 1000 MG/100ML IV EMUL
INTRAVENOUS | Status: AC
  Filled 2017-04-04: qty 100

## 2017-04-04 MED ORDER — ONDANSETRON HCL 4 MG/2ML IJ SOLN
4.0000 mg | Freq: Four times a day (QID) | INTRAMUSCULAR | Status: DC | PRN
  Administered 2017-04-04 – 2017-04-05 (×2): 4 mg via INTRAVENOUS
  Filled 2017-04-04 (×2): qty 2

## 2017-04-04 MED ORDER — METHOCARBAMOL 1000 MG/10ML IJ SOLN
500.0000 mg | Freq: Four times a day (QID) | INTRAVENOUS | Status: DC | PRN
  Filled 2017-04-04: qty 5

## 2017-04-04 MED ORDER — SODIUM CHLORIDE 0.9 % IR SOLN
Status: DC | PRN
  Administered 2017-04-04: 3000 mL

## 2017-04-04 MED ORDER — METOCLOPRAMIDE HCL 5 MG PO TABS: 5.0000 mg | ORAL_TABLET | Freq: Three times a day (TID) | ORAL | Status: DC | PRN

## 2017-04-04 MED ORDER — METHOCARBAMOL 500 MG PO TABS
500.0000 mg | ORAL_TABLET | Freq: Four times a day (QID) | ORAL | Status: DC | PRN
  Administered 2017-04-04 – 2017-04-07 (×8): 500 mg via ORAL
  Filled 2017-04-04 (×7): qty 1

## 2017-04-04 MED ORDER — PROPOFOL 500 MG/50ML IV EMUL
INTRAVENOUS | Status: DC | PRN
  Administered 2017-04-04: 50 ug/kg/min via INTRAVENOUS

## 2017-04-04 MED ORDER — ROPIVACAINE HCL 7.5 MG/ML IJ SOLN
INTRAMUSCULAR | Status: DC | PRN
  Administered 2017-04-04: 20 mL via PERINEURAL

## 2017-04-04 MED ORDER — FENTANYL CITRATE (PF) 100 MCG/2ML IJ SOLN
INTRAMUSCULAR | Status: AC
  Filled 2017-04-04: qty 2

## 2017-04-04 MED ORDER — HYDROMORPHONE HCL 1 MG/ML IJ SOLN
0.5000 mg | INTRAMUSCULAR | Status: DC | PRN
  Administered 2017-04-05: 0.5 mg via INTRAVENOUS
  Filled 2017-04-04: qty 1

## 2017-04-04 MED ORDER — PROPOFOL 10 MG/ML IV BOLUS
INTRAVENOUS | Status: DC | PRN
  Administered 2017-04-04: 10 mg via INTRAVENOUS

## 2017-04-04 MED ORDER — ACETAMINOPHEN 650 MG RE SUPP: 650.0000 mg | Freq: Four times a day (QID) | RECTAL | Status: DC | PRN

## 2017-04-04 MED ORDER — MIDAZOLAM HCL 2 MG/2ML IJ SOLN
INTRAMUSCULAR | Status: AC
  Filled 2017-04-04: qty 2

## 2017-04-04 SURGICAL SUPPLY — 57 items
BANDAGE ACE 6X5 VEL STRL LF (GAUZE/BANDAGES/DRESSINGS) ×4 IMPLANT
BANDAGE ELASTIC 6 VELCRO ST LF (GAUZE/BANDAGES/DRESSINGS) ×4 IMPLANT
BANDAGE ESMARK 6X9 LF (GAUZE/BANDAGES/DRESSINGS) ×1 IMPLANT
BENZOIN TINCTURE PRP APPL 2/3 (GAUZE/BANDAGES/DRESSINGS) ×2 IMPLANT
BLADE SAG 18X100X1.27 (BLADE) ×2 IMPLANT
BNDG ESMARK 6X9 LF (GAUZE/BANDAGES/DRESSINGS) ×2
BOWL SMART MIX CTS (DISPOSABLE) ×2 IMPLANT
CAPT KNEE TOTAL 3 ×2 IMPLANT
CEMENT BONE SIMPLEX SPEEDSET (Cement) ×4 IMPLANT
CLOSURE STERI-STRIP 1/4X4 (GAUZE/BANDAGES/DRESSINGS) ×2 IMPLANT
COVER SURGICAL LIGHT HANDLE (MISCELLANEOUS) ×2 IMPLANT
CUFF TOURNIQUET SINGLE 34IN LL (TOURNIQUET CUFF) ×2 IMPLANT
DRAPE EXTREMITY T 121X128X90 (DRAPE) ×2 IMPLANT
DRAPE HALF SHEET 40X57 (DRAPES) ×2 IMPLANT
DRAPE U-SHAPE 47X51 STRL (DRAPES) ×2 IMPLANT
DRSG PAD ABDOMINAL 8X10 ST (GAUZE/BANDAGES/DRESSINGS) ×4 IMPLANT
DURAPREP 26ML APPLICATOR (WOUND CARE) ×4 IMPLANT
ELECT CAUTERY BLADE 6.4 (BLADE) ×2 IMPLANT
ELECT REM PT RETURN 9FT ADLT (ELECTROSURGICAL) ×2
ELECTRODE REM PT RTRN 9FT ADLT (ELECTROSURGICAL) ×1 IMPLANT
FACESHIELD WRAPAROUND (MASK) ×4 IMPLANT
GAUZE SPONGE 4X4 12PLY STRL (GAUZE/BANDAGES/DRESSINGS) ×2 IMPLANT
GLOVE BIOGEL PI IND STRL 8 (GLOVE) ×2 IMPLANT
GLOVE BIOGEL PI INDICATOR 8 (GLOVE) ×2
GLOVE ORTHO TXT STRL SZ7.5 (GLOVE) ×2 IMPLANT
GLOVE SURG ORTHO 8.0 STRL STRW (GLOVE) ×2 IMPLANT
GLOVE SURG SS PI 6.5 STRL IVOR (GLOVE) ×2 IMPLANT
GLOVE SURG SS PI 7.0 STRL IVOR (GLOVE) ×4 IMPLANT
GOWN STRL REUS W/ TWL LRG LVL3 (GOWN DISPOSABLE) ×2 IMPLANT
GOWN STRL REUS W/ TWL XL LVL3 (GOWN DISPOSABLE) ×2 IMPLANT
GOWN STRL REUS W/TWL LRG LVL3 (GOWN DISPOSABLE) ×2
GOWN STRL REUS W/TWL XL LVL3 (GOWN DISPOSABLE) ×2
HANDPIECE INTERPULSE COAX TIP (DISPOSABLE) ×1
IMMOBILIZER KNEE 22 UNIV (SOFTGOODS) ×2 IMPLANT
KIT BASIN OR (CUSTOM PROCEDURE TRAY) ×2 IMPLANT
KIT ROOM TURNOVER OR (KITS) ×2 IMPLANT
MANIFOLD NEPTUNE II (INSTRUMENTS) ×2 IMPLANT
NS IRRIG 1000ML POUR BTL (IV SOLUTION) ×2 IMPLANT
PACK TOTAL JOINT (CUSTOM PROCEDURE TRAY) ×2 IMPLANT
PAD ARMBOARD 7.5X6 YLW CONV (MISCELLANEOUS) ×2 IMPLANT
PADDING CAST COTTON 6X4 STRL (CAST SUPPLIES) ×2 IMPLANT
SET HNDPC FAN SPRY TIP SCT (DISPOSABLE) ×1 IMPLANT
SET PAD KNEE POSITIONER (MISCELLANEOUS) ×2 IMPLANT
STAPLER VISISTAT 35W (STAPLE) ×2 IMPLANT
SUCTION FRAZIER HANDLE 10FR (MISCELLANEOUS) ×1
SUCTION TUBE FRAZIER 10FR DISP (MISCELLANEOUS) ×1 IMPLANT
SUT MNCRL AB 3-0 PS2 18 (SUTURE) ×2 IMPLANT
SUT MNCRL AB 4-0 PS2 18 (SUTURE) IMPLANT
SUT VIC AB 0 CT1 27 (SUTURE) ×1
SUT VIC AB 0 CT1 27XBRD ANBCTR (SUTURE) ×1 IMPLANT
SUT VIC AB 1 CT1 27 (SUTURE) ×3
SUT VIC AB 1 CT1 27XBRD ANBCTR (SUTURE) ×3 IMPLANT
SUT VIC AB 2-0 CT1 27 (SUTURE) ×2
SUT VIC AB 2-0 CT1 TAPERPNT 27 (SUTURE) ×2 IMPLANT
TOWEL OR 17X26 10 PK STRL BLUE (TOWEL DISPOSABLE) ×2 IMPLANT
TRAY FOLEY CATH 14FR (SET/KITS/TRAYS/PACK) ×2 IMPLANT
WRAP KNEE MAXI GEL POST OP (GAUZE/BANDAGES/DRESSINGS) ×2 IMPLANT

## 2017-04-04 NOTE — Anesthesia Procedure Notes (Signed)
Spinal  Patient location during procedure: OR Start time: 04/04/2017 1:42 PM End time: 04/04/2017 2:56 PM Staffing Anesthesiologist: Sharee Holster Performed: anesthesiologist  Preanesthetic Checklist Completed: patient identified, site marked, pre-op evaluation, IV checked, risks and benefits discussed and monitors and equipment checked Spinal Block Patient position: sitting Prep: ChloraPrep and site prepped and draped Patient monitoring: heart rate, cardiac monitor, continuous pulse ox and blood pressure Approach: right paramedian Location: L3-4 Injection technique: single-shot Needle Needle type: Quincke  Needle gauge: 25 G Needle insertion depth: 4 cm Assessment Sensory level: T6

## 2017-04-04 NOTE — H&P (Signed)
TOTAL KNEE ADMISSION H&P  Patient is being admitted for left total knee arthroplasty.  Subjective:  Chief Complaint:left knee pain.  HPI: Claudia Rocha, 76 y.o. female, has a history of pain and functional disability in the left knee due to arthritis and has failed non-surgical conservative treatments for greater than 12 weeks to includeNSAID's and/or analgesics, corticosteriod injections, viscosupplementation injections, flexibility and strengthening excercises, use of assistive devices, weight reduction as appropriate and activity modification.  Onset of symptoms was gradual, starting 2 years ago with gradually worsening course since that time. The patient noted no past surgery on the left knee(s).  Patient currently rates pain in the left knee(s) at 10 out of 10 with activity. Patient has night pain, worsening of pain with activity and weight bearing, pain that interferes with activities of daily living, pain with passive range of motion, crepitus and joint swelling.  Patient has evidence of subchondral sclerosis, periarticular osteophytes and joint space narrowing by imaging studies. There is no active infection.  Patient Active Problem List   Diagnosis Date Noted  . Unilateral primary osteoarthritis, left knee 04/04/2017  . Acquired hypothyroidism 07/24/2014  . Carotid artery stenosis 09/12/2012  . Pounding in head 04/23/2012  . Hypersomnolence 04/23/2012  . Syncope and collapse 12/27/2011  . Essential hypertension 12/27/2011   Past Medical History:  Diagnosis Date  . Arthritis    spine  . Diabetes mellitus without complication (HCC)    type II  . Heart murmur    patient denies  . Hypertension    new onset   . Hypothyroidism   . Pulmonary embolism (HCC)    2016, was on eliquis immediately after, no longer on this  . Syncope     Past Surgical History:  Procedure Laterality Date  . ABDOMINAL HYSTERECTOMY    . CATARACT EXTRACTION    . TONSILLECTOMY    . TUBAL LIGATION      No prescriptions prior to admission.   Allergies  Allergen Reactions  . Diclofenac Sodium Hives    Social History  Substance Use Topics  . Smoking status: Never Smoker  . Smokeless tobacco: Never Used  . Alcohol use No    No family history on file.   Review of Systems  Musculoskeletal: Positive for joint pain.  All other systems reviewed and are negative.   Objective:  Physical Exam  Constitutional: She is oriented to person, place, and time. She appears well-developed and well-nourished.  HENT:  Head: Normocephalic and atraumatic.  Eyes: Pupils are equal, round, and reactive to light. EOM are normal.  Neck: Normal range of motion. Neck supple.  Cardiovascular: Normal rate and regular rhythm.   Respiratory: Effort normal and breath sounds normal.  GI: Soft. Bowel sounds are normal.  Musculoskeletal:       Left knee: She exhibits decreased range of motion, swelling, effusion and abnormal alignment. Tenderness found. Medial joint line and lateral joint line tenderness noted.  Neurological: She is alert and oriented to person, place, and time.  Skin: Skin is warm and dry.  Psychiatric: She has a normal mood and affect.    Vital signs in last 24 hours:    Labs:   Estimated body mass index is 31.69 kg/m as calculated from the following:   Height as of 03/30/17: 5\' 3"  (1.6 m).   Weight as of 03/30/17: 178 lb 14.4 oz (81.1 kg).   Imaging Review Plain radiographs demonstrate severe degenerative joint disease of the left knee(s). The overall alignment ismild varus. The  bone quality appears to be good for age and reported activity level.  Assessment/Plan:  End stage arthritis, left knee   The patient history, physical examination, clinical judgment of the provider and imaging studies are consistent with end stage degenerative joint disease of the left knee(s) and total knee arthroplasty is deemed medically necessary. The treatment options including medical management,  injection therapy arthroscopy and arthroplasty were discussed at length. The risks and benefits of total knee arthroplasty were presented and reviewed. The risks due to aseptic loosening, infection, stiffness, patella tracking problems, thromboembolic complications and other imponderables were discussed. The patient acknowledged the explanation, agreed to proceed with the plan and consent was signed. Patient is being admitted for inpatient treatment for surgery, pain control, PT, OT, prophylactic antibiotics, VTE prophylaxis, progressive ambulation and ADL's and discharge planning. The patient is planning to be discharged home with home health services

## 2017-04-04 NOTE — Anesthesia Procedure Notes (Signed)
Spinal

## 2017-04-04 NOTE — Anesthesia Preprocedure Evaluation (Addendum)
Anesthesia Evaluation  Patient identified by MRN, date of birth, ID band Patient awake    Reviewed: Allergy & Precautions, NPO status   History of Anesthesia Complications Negative for: history of anesthetic complications  Airway Mallampati: II  TM Distance: >3 FB Neck ROM: Full    Dental no notable dental hx.    Pulmonary neg pulmonary ROS,    breath sounds clear to auscultation       Cardiovascular hypertension, + Peripheral Vascular Disease   Rhythm:Regular Rate:Normal     Neuro/Psych  Headaches,    GI/Hepatic negative GI ROS, Neg liver ROS,   Endo/Other  diabetes  Renal/GU negative Renal ROS     Musculoskeletal   Abdominal   Peds  Hematology   Anesthesia Other Findings   Reproductive/Obstetrics                            Anesthesia Physical Anesthesia Plan  ASA: III  Anesthesia Plan: Spinal   Post-op Pain Management:    Induction: Intravenous  PONV Risk Score and Plan: 2 and Ondansetron and Dexamethasone  Airway Management Planned: Natural Airway and Simple Face Mask  Additional Equipment:   Intra-op Plan:   Post-operative Plan:   Informed Consent: I have reviewed the patients History and Physical, chart, labs and discussed the procedure including the risks, benefits and alternatives for the proposed anesthesia with the patient or authorized representative who has indicated his/her understanding and acceptance.     Plan Discussed with: CRNA  Anesthesia Plan Comments:         Anesthesia Quick Evaluation

## 2017-04-04 NOTE — Brief Op Note (Signed)
04/04/2017  3:21 PM  PATIENT:  Christin Fudge  76 y.o. female  PRE-OPERATIVE DIAGNOSIS:  left knee osteoarthritis  POST-OPERATIVE DIAGNOSIS:  left knee osteoarthritis  PROCEDURE:  Procedure(s): LEFT TOTAL KNEE ARTHROPLASTY (Left)  SURGEON:  Surgeon(s) and Role:    Kathryne Hitch, MD - Primary  PHYSICIAN ASSISTANT: Rexene Edison, PA-C  ANESTHESIA:   regional and spinal  EBL:  Total I/O In: 1000 [I.V.:1000] Out: -   COUNTS:  YES  TOURNIQUET:   Total Tourniquet Time Documented: Thigh (Left) - 48 minutes Total: Thigh (Left) - 48 minutes   DICTATION: .Other Dictation: Dictation Number [id number]  PLAN OF CARE: Admit to inpatient   PATIENT DISPOSITION:  PACU - hemodynamically stable.   Delay start of Pharmacological VTE agent (>24hrs) due to surgical blood loss or risk of bleeding: no

## 2017-04-04 NOTE — Progress Notes (Signed)
After getting off elevator to floor, pt had emesis approx. 40cc green liq. "Feels better". Request break from CPM machine, so out of CPM at 1835.

## 2017-04-04 NOTE — Transfer of Care (Signed)
Immediate Anesthesia Transfer of Care Note  Patient: Claudia Rocha  Procedure(s) Performed: Procedure(s): LEFT TOTAL KNEE ARTHROPLASTY (Left)  Patient Location: PACU  Anesthesia Type:MAC combined with regional for post-op pain  Level of Consciousness: awake, alert  and patient cooperative  Airway & Oxygen Therapy: Patient Spontanous Breathing  Post-op Assessment: Report given to RN and Post -op Vital signs reviewed and stable  Post vital signs: Reviewed and stable  Last Vitals:  Vitals:   04/04/17 1204 04/04/17 1208  BP: (!) 199/58 (!) (P) 179/53  Pulse: 64   Temp: 36.7 C   SpO2: 100%     Last Pain:  Vitals:   04/04/17 1204  TempSrc: Oral         Complications: No apparent anesthesia complications

## 2017-04-04 NOTE — Anesthesia Procedure Notes (Addendum)
Anesthesia Regional Block: Adductor canal block   Pre-Anesthetic Checklist: ,, timeout performed, Correct Patient, Correct Site, Correct Laterality, Correct Procedure, Correct Position, site marked, Risks and benefits discussed,  Surgical consent,  Pre-op evaluation,  At surgeon's request and post-op pain management  Laterality: Left and Lower  Prep: chloraprep       Needles:   Needle Type: Echogenic Stimulator Needle      Needle Gauge: 21   Needle insertion depth: 5 cm   Additional Needles:   Procedures: ultrasound guided,,,,,,,,  Narrative:  Start time: 04/04/2017 1:20 PM End time: 04/04/2017 1:37 PM Injection made incrementally with aspirations every 5 mL.  Performed by: Personally  Anesthesiologist: Marieli Rudy

## 2017-04-04 NOTE — Op Note (Signed)
NAMEWYLDA, COSGROVE NO.:  [id number]  MEDICAL RECORD NO.:  [id number]  LOCATION:  PERIO                        FACILITY:  [facility]  PHYSICIAN:  Vanita Panda. Magnus Ivan, M.D.DATE OF BIRTH:  1940/11/21  DATE OF PROCEDURE:  04/04/2017 DATE OF DISCHARGE:                              OPERATIVE REPORT   PREOPERATIVE DIAGNOSIS:  Primary osteoarthritis and degenerative joint disease, left knee.  POSTOPERATIVE DIAGNOSIS:  Primary osteoarthritis and degenerative joint disease, left knee.  PROCEDURE:  Left total knee arthroplasty.  IMPLANTS:  Stryker Triathlon knee with size 3 femur, size 3 tibial tray, 11-mm fix-bearing polyethylene insert, size 29 patellar button.  SURGEON:  Vanita Panda. Magnus Ivan, M.D.  ASSISTANT:  Richardean Canal, PA-C.  ANESTHESIA: 1. Left lower extremity adductor canal block. 2. Spinal.  ANTIBIOTICS:  2 g of IV Ancef.  BLOOD LOSS:  100 mL.  TOURNIQUET TIME:  Less than 1 hour.  COMPLICATIONS:  None.  INDICATIONS:  Ms. Umbarger is a 76 year old with debilitating arthritis involving her left knee.  She has tried and failed all forms of conservative treatment.  Her pain is daily and it is detrimentally affected her activities of daily living, her quality of life, and her mobility.  At this point, she does wish to proceed with a total knee arthroplasty.  She understands the risk of acute blood loss anemia, nerve and vessel injury, fracture, infection, and DVT.  She understands our goals are to decrease pain, improve mobility and overall improved quality of life.  PROCEDURE DESCRIPTION:  After informed consent was obtained, appropriate left knee was marked.  She was brought to the operating room after an adductor canal block was obtained in the holding area.  In the operating room, she was set up on the table and spinal anesthesia was obtained. She was then laid in a supine position.  A Foley catheter was placed.  A nonsterile tourniquet was  placed around her upper left thigh.  Her left leg was prepped and draped from the thigh down the ankle with DuraPrep and sterile drapes including a sterile stockinette.  Time-out was called, she was identified as correct patient and correct left knee.  We then used an Esmarch to wrap out the leg and tourniquet was inflated to 300 mm of pressure.  I then made a direct midline incision over the patella and carried this proximally and distally.  I dissected down the knee joint and carried out a medial parapatellar arthrotomy finding a large joint effusion, significant periarticular osteophytes and cartilage loss on the knee.  With the knee in a flexed position, we removed the ACL, PCL, medial and lateral meniscus.  We used extramedullary cutting guide to make our tibia cut.  We set our cut for taking 9 mm off the high side correcting for varus and valgus and neutral slope.  We made this cut without difficulty.  We then used the intramedullary guide through the notch of the femur to make our distal femoral cut.  We set our distal femoral cutting guide for a left knee for 5 degrees externally rotated for an 8-mm distal femoral cut.  We made this cut without difficulty.  We brought the knee back down  to full extension and with a 9-mm extension block, we obtained full extension. We then went back to the femur and put our femoral sizing guide based off the epicondylar axis for 3 degrees externally rotated.  We chose the size 3 femur.  We put a 4-in-1 cutting block for a size 3 femur, we made our anterior, posterior cuts followed by our chamfer cuts.  We then made our femoral box cut.  Attention was then turned back to the tibia.  With assessing the tibial plateau, we chose a size 3 tibia for coverage, the 3 tibial tray was set with this rotation off the tibial tubercle on the femur and then we made our keel punch off this.  With the trial tibia tray size 3 and the trial size 3 femur, we trialed a  9 and then 11-mm fix-bearing polyethylene insert.  I was pleased with stability and range of motion with 11-mm insert.  We then cut our patella and drilled 3 holes for patellar button, size 29.  We then removed all trial components and irrigated the knee with normal saline solution.  We mixed our cement and then cemented the real Stryker Triathlon tibial tray size 3, followed by the real size 3 femur.  We removed cement debris from the knee and then placed 11-mm fix-bearing polyethylene insert after cementing the femoral and tibial components and cleaning the cement debris.  With the 11-mm fix bearing insert in place, we brought the knee back down in extension and cement our patellar button.  Once the cement had hardened, we let the tourniquet down and hemostasis was obtained with electrocautery.  We irrigated the knee again with normal saline solution and then closed the arthrotomy with interrupted #1 Vicryl suture followed by 0 Vicryl in the deep tissue, 2-0 Vicryl in the subcutaneous tissue, 4-0 Monocryl subcuticular stitch and Steri-Strips on the skin.  Well-padded sterile dressing was applied.  She was taken to the recovery room in stable condition.  All final counts were correct.  There were no complications noted.  Of note, Richardean Canal, PA- C, assisted the entire case.  His assistance was crucial for facilitating all aspects of this case.     Vanita Panda. Magnus Ivan, M.D.     CYB/MEDQ  D:  04/04/2017  T:  04/04/2017  Job:  [id number]

## 2017-04-04 NOTE — Progress Notes (Signed)
Orthopedic Tech Progress Note Patient Details:  Claudia Rocha 1941/04/26 [id number]  CPM Left Knee CPM Left Knee: On Left Knee Flexion (Degrees): 90 Left Knee Extension (Degrees): 0 Additional Comments: Foot roll   Saul Fordyce 04/04/2017, 4:13 PM

## 2017-04-05 ENCOUNTER — Encounter (HOSPITAL_COMMUNITY): Payer: Self-pay | Admitting: Orthopaedic Surgery

## 2017-04-05 LAB — CBC
HEMATOCRIT: 29.9 % — AB (ref 36.0–46.0)
HEMOGLOBIN: 9.5 g/dL — AB (ref 12.0–15.0)
MCH: 30 pg (ref 26.0–34.0)
MCHC: 31.8 g/dL (ref 30.0–36.0)
MCV: 94.3 fL (ref 78.0–100.0)
Platelets: 158 10*3/uL (ref 150–400)
RBC: 3.17 MIL/uL — ABNORMAL LOW (ref 3.87–5.11)
RDW: 13.8 % (ref 11.5–15.5)
WBC: 11.2 10*3/uL — ABNORMAL HIGH (ref 4.0–10.5)

## 2017-04-05 LAB — BASIC METABOLIC PANEL
Anion gap: 6 (ref 5–15)
BUN: 22 mg/dL — AB (ref 6–20)
CHLORIDE: 105 mmol/L (ref 101–111)
CO2: 27 mmol/L (ref 22–32)
CREATININE: 1.28 mg/dL — AB (ref 0.44–1.00)
Calcium: 8.7 mg/dL — ABNORMAL LOW (ref 8.9–10.3)
GFR calc non Af Amer: 40 mL/min — ABNORMAL LOW (ref >60)
GFR, EST AFRICAN AMERICAN: 46 mL/min — AB (ref >60)
GLUCOSE: 182 mg/dL — AB (ref 65–99)
Potassium: 4.9 mmol/L (ref 3.5–5.1)
Sodium: 138 mmol/L (ref 135–145)

## 2017-04-05 LAB — GLUCOSE, CAPILLARY: Glucose-Capillary: 117 mg/dL — ABNORMAL HIGH (ref 65–99)

## 2017-04-05 NOTE — Evaluation (Signed)
Occupational Therapy Evaluation Patient Details Name: Claudia Rocha MRN: [id number] DOB: May 04, 1941 Today's Date: 04/05/2017    History of Present Illness 76 y.o. Female admitted for elective L TKA. PMH including DM2, HTN, cataract extraction, and pulmonary embolism.     Clinical Impression   PTA, pt was living with her husband and was independent. Currently, pt requires Min A for LB ADLs and functional mobility using RW. Provided education on LB ADLs and toilet transfer with 3N1; pt demonstrated understanding. Pt would benefit from further acute OT to facilitate safe dc and increase independence with ADLs and functional mobility. Recommend dc home once medically stable per physician.      Follow Up Recommendations  DC plan and follow up therapy as arranged by surgeon;Supervision/Assistance - 24 hour    Equipment Recommendations  None recommended by OT    Recommendations for Other Services PT consult     Precautions / Restrictions Precautions Precautions: Knee Precaution Booklet Issued: No Precaution Comments: Reviewed knee precautions and resting with knee in ex Restrictions Weight Bearing Restrictions: Yes LLE Weight Bearing: Weight bearing as tolerated      Mobility Bed Mobility Overal bed mobility: Needs Assistance Bed Mobility: Supine to Sit     Supine to sit: Min assist;HOB elevated     General bed mobility comments: Pt used bedrails and required HOB elevated. Min A to pull trunk into sitting.  Transfers Overall transfer level: Needs assistance Equipment used: Rolling walker (2 wheeled) (Youth sized RW) Transfers: Sit to/from Stand Sit to Stand: Min assist         General transfer comment: Min A to power up into standing. VCs thorughout for tehcnique    Balance Overall balance assessment: Needs assistance Sitting-balance support: Feet supported;No upper extremity supported Sitting balance-Leahy Scale: Good Sitting balance - Comments: Good balance  during LB dressing   Standing balance support: During functional activity;No upper extremity supported Standing balance-Leahy Scale: Fair Standing balance comment: Maintained standing balance at sink for grooming                           ADL either performed or assessed with clinical judgement   ADL Overall ADL's : Needs assistance/impaired Eating/Feeding: Set up;Sitting   Grooming: Oral care;Wash/dry face;Min guard;Standing   Upper Body Bathing: Set up;Sitting   Lower Body Bathing: Minimal assistance;Sit to/from stand   Upper Body Dressing : Set up;Sitting   Lower Body Dressing: Minimal assistance;Sit to/from stand Lower Body Dressing Details (indicate cue type and reason): Pt donned underwear with Min A to bring pant legs over Bil feet. Pt would benefit from education on AE for LB ADLs Toilet Transfer: Minimal assistance;BSC;RW;Ambulation;Cueing for sequencing Toilet Transfer Details (indicate cue type and reason): Educated pt on toielt transfer with Min A to power up into standing Toileting- Clothing Manipulation and Hygiene: Min guard;Sit to/from stand       Functional mobility during ADLs: Min guard;Rolling walker;Cueing for sequencing General ADL Comments: Pt performing ADLs and functional mobility at [employer] A level. Pt would benefit from education on AE. Feel pt will progress well with time     Vision         Perception     Praxis      Pertinent Vitals/Pain Pain Assessment: 0-10 Pain Score: 5  Pain Location: L knee Pain Descriptors / Indicators: Constant;Discomfort;Grimacing Pain Intervention(s): Monitored during session;Limited activity within patient's tolerance;Premedicated before session;Repositioned     Hand Dominance Right  Extremity/Trunk Assessment Upper Extremity Assessment Upper Extremity Assessment: Overall WFL for tasks assessed   Lower Extremity Assessment Lower Extremity Assessment: Defer to PT evaluation        Communication Communication Communication: No difficulties   Cognition Arousal/Alertness: Awake/alert Behavior During Therapy: WFL for tasks assessed/performed Overall Cognitive Status: Within Functional Limits for tasks assessed                                     General Comments  Pt daughter and husband present throughout session    Exercises     Shoulder Instructions      Home Living Family/patient expects to be discharged to:: Private residence Living Arrangements: Spouse/significant other Available Help at Discharge: Family;Available 24 hours/day (Spouse and daughter) Type of Home: House Home Access: Stairs to enter [employer] of Steps: 1   Home Layout: Multi-level;Other (Comment) (Split level home) Alternate Level Stairs-Number of Steps: 6 Alternate Level Stairs-Rails: Right Bathroom Shower/Tub: Tub/shower unit;Curtain   Bathroom Toilet: Standard (with handle bars) Bathroom Accessibility: Yes How Accessible: Accessible via walker Home Equipment: Other (comment);Cane - single point;Walker - standard (Tub seat that lowers into tub)          Prior Functioning/Environment Level of Independence: Independent        Comments: ADLs, IADLs, driving, gardening        OT Problem List: Decreased range of motion;Decreased activity tolerance;Impaired balance (sitting and/or standing);Decreased knowledge of use of DME or AE;Decreased knowledge of precautions;Pain      OT Treatment/Interventions: Self-care/ADL training;Therapeutic exercise;Energy conservation;DME and/or AE instruction;Therapeutic activities;Patient/family education    OT Goals(Current goals can be found in the care plan section) Acute Rehab OT Goals Patient Stated Goal: Go home and get stronger OT Goal Formulation: With patient Time For Goal Achievement: 04/19/17 Potential to Achieve Goals: Good ADL Goals Pt Will Perform Grooming: with set-up;with supervision;standing Pt  Will Perform Lower Body Dressing: with min guard assist;sit to/from stand;with adaptive equipment Pt Will Transfer to Toilet: with set-up;with supervision;bedside commode;ambulating Pt Will Perform Toileting - Clothing Manipulation and hygiene: with set-up;with supervision;sit to/from stand  OT Frequency: Min 2X/week   Barriers to D/C:            Co-evaluation              AM-PAC PT "6 Clicks" Daily Activity     Outcome Measure Help from another person eating meals?: None Help from another person taking care of personal grooming?: A Little Help from another person toileting, which includes using toliet, bedpan, or urinal?: A Little Help from another person bathing (including washing, rinsing, drying)?: A Little Help from another person to put on and taking off regular upper body clothing?: None Help from another person to put on and taking off regular lower body clothing?: A Little 6 Click Score: 20   End of Session Equipment Utilized During Treatment: Gait belt;Rolling walker CPM Left Knee CPM Left Knee: On Left Knee Flexion (Degrees): 35 Left Knee Extension (Degrees): 0 Additional Comments: Bone foam placed at end of session Nurse Communication: Mobility status  Activity Tolerance: Patient tolerated treatment well Patient left: in chair;with call bell/phone within reach;with family/visitor present  OT Visit Diagnosis: Unsteadiness on feet (R26.81);Other abnormalities of gait and mobility (R26.89);Pain Pain - Right/Left: Left Pain - part of body: Knee                Time: 1610-9604 OT Time Calculation (  min): 43 min Charges:  OT General Charges $OT Visit: 1 Procedure OT Evaluation $OT Eval Low Complexity: 1 Procedure OT Treatments $Self Care/Home Management : 23-37 mins G-Codes:     Kaizen Ibsen MSOT, OTR/L Acute Rehab Pager: [phone] Office: [phone]  Theodoro Grist Beniah Magnan 04/05/2017, 9:27 AM

## 2017-04-05 NOTE — Progress Notes (Signed)
Subjective: 1 Day Post-Op Procedure(s) (LRB): LEFT TOTAL KNEE ARTHROPLASTY (Left) Patient reports pain as moderate.  Asymptomatic acute blood loss anemia from surgery.  Objective: Vital signs in last 24 hours: Temp:  [97 F (36.1 C)-99.4 F (37.4 C)] 99.4 F (37.4 C) (08/22 0451) Pulse Rate:  [47-75] 75 (08/22 0451) Resp:  [12-18] 18 (08/22 0451) BP: (114-199)/(51-64) 145/56 (08/22 0451) SpO2:  [96 %-100 %] 97 % (08/22 0451) FiO2 (%):  [0 %] 0 % (08/21 1932) Weight:  [178 lb (80.7 kg)] 178 lb (80.7 kg) (08/21 1204)  Intake/Output from previous day: 08/21 0701 - 08/22 0700 In: 1670 [P.O.:120; I.V.:1300] Out: 550 [Urine:500; Blood:50] Intake/Output this shift: No intake/output data recorded.   Recent Labs  04/05/17 0330  HGB 9.5*    Recent Labs  04/05/17 0330  WBC 11.2*  RBC 3.17*  HCT 29.9*  PLT 158    Recent Labs  04/05/17 0330  NA 138  K 4.9  CL 105  CO2 27  BUN 22*  CREATININE 1.28*  GLUCOSE 182*  CALCIUM 8.7*   No results for input(s): LABPT, INR in the last 72 hours.  Sensation intact distally Intact pulses distally Dorsiflexion/Plantar flexion intact Incision: dressing C/D/I Compartment soft  Assessment/Plan: 1 Day Post-Op Procedure(s) (LRB): LEFT TOTAL KNEE ARTHROPLASTY (Left) Up with therapy  Kathryne Hitch 04/05/2017, 7:43 AM

## 2017-04-05 NOTE — Discharge Instructions (Addendum)
Information on my medicine - XARELTO (Rivaroxaban)  This medication education was reviewed with me or my healthcare representative as part of my discharge preparation.    Why was Xarelto prescribed for you? Xarelto was prescribed for you to reduce the risk of blood clots forming after orthopedic surgery. The medical term for these abnormal blood clots is venous thromboembolism (VTE).  What do you need to know about xarelto ? Take your Xarelto ONCE DAILY at the same time every day. You may take it either with or without food.  If you have difficulty swallowing the tablet whole, you may crush it and mix in applesauce just prior to taking your dose. INSTRUCTIONS AFTER JOINT REPLACEMENT   o Remove items at home which could result in a fall. This includes throw rugs or furniture in walking pathways o ICE to the affected joint every three hours while awake for 30 minutes at a time, for at least the first 3-5 days, and then as needed for pain and swelling.  Continue to use ice for pain and swelling. You may notice swelling that will progress down to the foot and ankle.  This is normal after surgery.  Elevate your leg when you are not up walking on it.   o Continue to use the breathing machine you got in the hospital (incentive spirometer) which will help keep your temperature down.  It is common for your temperature to cycle up and down following surgery, especially at night when you are not up moving around and exerting yourself.  The breathing machine keeps your lungs expanded and your temperature down.   DIET:  As you were doing prior to hospitalization, we recommend a well-balanced diet.  DRESSING / WOUND CARE / SHOWERING  Keep the surgical dressing until follow up.  The dressing is water proof, so you can shower without any extra covering.  IF THE DRESSING FALLS OFF or the wound gets wet inside, change the dressing with sterile gauze.  Please use good hand washing techniques before changing  the dressing.  Do not use any lotions or creams on the incision until instructed by your surgeon.    ACTIVITY  o Increase activity slowly as tolerated, but follow the weight bearing instructions below.   o No driving for 6 weeks or until further direction given by your physician.  You cannot drive while taking narcotics.  o No lifting or carrying greater than 10 lbs. until further directed by your surgeon. o Avoid periods of inactivity such as sitting longer than an hour when not asleep. This helps prevent blood clots.  o You may return to work once you are authorized by your doctor.     WEIGHT BEARING   Weight bearing as tolerated with assist device (walker, cane, etc) as directed, use it as long as suggested by your surgeon or therapist, typically at least 4-6 weeks.   EXERCISES  Results after joint replacement surgery are often greatly improved when you follow the exercise, range of motion and muscle strengthening exercises prescribed by your doctor. Safety measures are also important to protect the joint from further injury. Any time any of these exercises cause you to have increased pain or swelling, decrease what you are doing until you are comfortable again and then slowly increase them. If you have problems or questions, call your caregiver or physical therapist for advice.   Rehabilitation is important following a joint replacement. After just a few days of immobilization, the muscles of the leg can become  weakened and shrink (atrophy).  These exercises are designed to build up the tone and strength of the thigh and leg muscles and to improve motion. Often times heat used for twenty to thirty minutes before working out will loosen up your tissues and help with improving the range of motion but do not use heat for the first two weeks following surgery (sometimes heat can increase post-operative swelling).   These exercises can be done on a training (exercise) mat, on the floor, on a  table or on a bed. Use whatever works the best and is most comfortable for you.    Use music or television while you are exercising so that the exercises are a pleasant break in your day. This will make your life better with the exercises acting as a break in your routine that you can look forward to.   Perform all exercises about fifteen times, three times per day or as directed.  You should exercise both the operative leg and the other leg as well.  Exercises include:    Quad Sets - Tighten up the muscle on the front of the thigh (Quad) and hold for 5-10 seconds.    Straight Leg Raises - With your knee straight (if you were given a brace, keep it on), lift the leg to 60 degrees, hold for 3 seconds, and slowly lower the leg.  Perform this exercise against resistance later as your leg gets stronger.   Leg Slides: Lying on your back, slowly slide your foot toward your buttocks, bending your knee up off the floor (only go as far as is comfortable). Then slowly slide your foot back down until your leg is flat on the floor again.   Angel Wings: Lying on your back spread your legs to the side as far apart as you can without causing discomfort.   Hamstring Strength:  Lying on your back, push your heel against the floor with your leg straight by tightening up the muscles of your buttocks.  Repeat, but this time bend your knee to a comfortable angle, and push your heel against the floor.  You may put a pillow under the heel to make it more comfortable if necessary.   A rehabilitation program following joint replacement surgery can speed recovery and prevent re-injury in the future due to weakened muscles. Contact your doctor or a physical therapist for more information on knee rehabilitation.    CONSTIPATION  Constipation is defined medically as fewer than three stools per week and severe constipation as less than one stool per week.  Even if you have a regular bowel pattern at home, your normal regimen  is likely to be disrupted due to multiple reasons following surgery.  Combination of anesthesia, postoperative narcotics, change in appetite and fluid intake all can affect your bowels.   YOU MUST use at least one of the following options; they are listed in order of increasing strength to get the job done.  They are all available over the counter, and you may need to use some, POSSIBLY even all of these options:    Drink plenty of fluids (prune juice may be helpful) and high fiber foods Colace 100 mg by mouth twice a day  Senokot for constipation as directed and as needed Dulcolax (bisacodyl), take with full glass of water  Miralax (polyethylene glycol) once or twice a day as needed.  If you have tried all these things and are unable to have a bowel movement in the first 3-4  days after surgery call either your surgeon or your primary doctor.    If you experience loose stools or diarrhea, hold the medications until you stool forms back up.  If your symptoms do not get better within 1 week or if they get worse, check with your doctor.  If you experience "the worst abdominal pain ever" or develop nausea or vomiting, please contact the office immediately for further recommendations for treatment.   ITCHING:  If you experience itching with your medications, try taking only a single pain pill, or even half a pain pill at a time.  You can also use Benadryl over the counter for itching or also to help with sleep.   TED HOSE STOCKINGS:  Use stockings on both legs until for at least 2 weeks or as directed by physician office. They may be removed at night for sleeping.  MEDICATIONS:  See your medication summary on the After Visit Summary that nursing will review with you.  You may have some home medications which will be placed on hold until you complete the course of blood thinner medication.  It is important for you to complete the blood thinner medication as prescribed.  PRECAUTIONS:  If you  experience chest pain or shortness of breath - call 911 immediately for transfer to the hospital emergency department.   If you develop a fever greater that 101 F, purulent drainage from wound, increased redness or drainage from wound, foul odor from the wound/dressing, or calf pain - CONTACT YOUR SURGEON.                                                   FOLLOW-UP APPOINTMENTS:  If you do not already have a post-op appointment, please call the office for an appointment to be seen by your surgeon.  Guidelines for how soon to be seen are listed in your After Visit Summary, but are typically between 1-4 weeks after surgery.  OTHER INSTRUCTIONS:   Knee Replacement:  Do not place pillow under knee, focus on keeping the knee straight while resting. CPM instructions: 0-90 degrees, 2 hours in the morning, 2 hours in the afternoon, and 2 hours in the evening. Place foam block, curve side up under heel at all times except when in CPM or when walking.  DO NOT modify, tear, cut, or change the foam block in any way.  MAKE SURE YOU:   Understand these instructions.   Get help right away if you are not doing well or get worse.    Thank you for letting us be a part of your medical care team.  It is a privilege we respect greatly.  We hope these instructions will help you stay on track for a fast and full recovery!   Take Xarelto exactly as prescribed by your doctor and DO NOT stop taking Xarelto without talking to the doctor who prescribed the medication.  Stopping without other VTE prevention medication to take the place of Xarelto may increase your risk of developing a clot.  After discharge, you should have regular check-up appointments with your healthcare provider that is prescribing your Xarelto.    What do you do if you miss a dose? If you miss a dose, take it as soon as you remember on the same day then continue your regularly scheduled once daily regimen the next day.  Do not take two doses of  Xarelto on the same day.   Important Safety Information A possible side effect of Xarelto is bleeding. You should call your healthcare provider right away if you experience any of the following: ? Bleeding from an injury or your nose that does not stop. ? Unusual colored urine (red or dark brown) or unusual colored stools (red or black). ? Unusual bruising for unknown reasons. ? A serious fall or if you hit your head (even if there is no bleeding).  Some medicines may interact with Xarelto and might increase your risk of bleeding while on Xarelto. To help avoid this, consult your healthcare provider or pharmacist prior to using any new prescription or non-prescription medications, including herbals, vitamins, non-steroidal anti-inflammatory drugs (NSAIDs) and supplements.  This website has more information on Xarelto: VisitDestination.com.br.

## 2017-04-05 NOTE — Progress Notes (Signed)
Orthopedic Tech Progress Note Patient Details:  Claudia Rocha [birth year] [id number]  Patient ID: Claudia Rocha, female   DOB: October 04, 1940, 76 y.o.   MRN: [id number]   Nikki Dom 04/05/2017, 1:56 PM Placed pt's lle on cpm @0 -30 degrees @1355 ; will increase as pt tolerates; RN notified

## 2017-04-05 NOTE — Anesthesia Postprocedure Evaluation (Signed)
Anesthesia Post Note  Patient: Claudia Rocha  Procedure(s) Performed: Procedure(s) (LRB): LEFT TOTAL KNEE ARTHROPLASTY (Left)     Patient location during evaluation: PACU Anesthesia Type: Spinal Level of consciousness: oriented and awake and alert Pain management: pain level controlled Vital Signs Assessment: post-procedure vital signs reviewed and stable Respiratory status: spontaneous breathing, respiratory function stable and patient connected to nasal cannula oxygen Cardiovascular status: blood pressure returned to baseline and stable Postop Assessment: no headache and no backache Anesthetic complications: no    Last Vitals:  Vitals:   04/05/17 0010 04/05/17 0451  BP: (!) 151/58 (!) 145/56  Pulse: 73 75  Resp: 18 18  Temp: 36.8 C 37.4 C  SpO2: 98% 97%    Last Pain:  Vitals:   04/05/17 0451  TempSrc: Oral  PainSc:                  Crecencio Kwiatek,JAMES TERRILL

## 2017-04-05 NOTE — Progress Notes (Signed)
Orthopedic Tech Progress Note Patient Details:  Claudia Rocha May 15, 1941 [id number]  Ortho Devices Ortho Device/Splint Location: on cpm at 1835   [name] 04/05/2017, 6:38 PM

## 2017-04-05 NOTE — Evaluation (Signed)
Physical Therapy Evaluation Patient Details Name: Claudia Rocha MRN: [id number] DOB: 1940/12/10 Today's Date: 04/05/2017   History of Present Illness  Pt is a 76 y.o. female admitted for elective L TKA on 04/04/17. PMH including DM2, HTN, cataract extraction, and pulmonary embolism.     Clinical Impression  Pt presents with pain, decreased strength/ROM, and an overall decrease in functional mobility secondary to above. Able to stand and amb with RW and min guard; required minA for bed mobility secondary to increased pain. Further mobility limited by pain. Pt would benefit from continued acute PT services to maximize functional mobility and independence prior to d/c home with HHPT.     Follow Up Recommendations DC plan and follow up therapy as arranged by surgeon;Home health PT    Equipment Recommendations  Other (comment) (Youth-sized rolling walker with smaller front wheels)    Recommendations for Other Services       Precautions / Restrictions Precautions Precautions: Knee Precaution Booklet Issued: No Precaution Comments: Reviewed knee precautions and resting with knee in ex Restrictions Weight Bearing Restrictions: Yes LLE Weight Bearing: Weight bearing as tolerated      Mobility  Bed Mobility Overal bed mobility: Needs Assistance Bed Mobility: Sit to Supine     Supine to sit: Min assist;HOB elevated Sit to supine: Mod assist   General bed mobility comments: ModA to assist LLE into bed; modA to scoot up in bed secondary to increased pain  Transfers Overall transfer level: Needs assistance Equipment used: Rolling walker (2 wheeled) (youth sized RW) Transfers: Sit to/from Stand Sit to Stand: Min guard         General transfer comment: Stood with RW and min guard for balance; cues for technique  Ambulation/Gait Ambulation/Gait assistance: Min guard Ambulation Distance (Feet): 15 Feet Assistive device: Rolling walker (2 wheeled) Gait Pattern/deviations:  Step-to pattern;Antalgic;Decreased stride length;Decreased weight shift to left Gait velocity: Decreased Gait velocity interpretation: <1.8 ft/sec, indicative of risk for recurrent falls General Gait Details: Amb in room with RW and min guard for balance; cues for technique with RW. Further mobility limtied secondary to increased pain with mobility  Stairs            Wheelchair Mobility    Modified Rankin (Stroke Patients Only)       Balance Overall balance assessment: Needs assistance Sitting-balance support: Feet supported;No upper extremity supported;Feet unsupported Sitting balance-Leahy Scale: Good Sitting balance - Comments: Good balance during LB dressing   Standing balance support: During functional activity;No upper extremity supported Standing balance-Leahy Scale: Fair Standing balance comment: Able to stand with no UE support; reliant on BUE support for amb                             Pertinent Vitals/Pain Pain Assessment: Faces Pain Score: 5  Faces Pain Scale: Hurts whole lot Pain Location: L knee Pain Descriptors / Indicators: Constant;Discomfort;Grimacing Pain Intervention(s): Limited activity within patient's tolerance;Monitored during session;Patient requesting pain meds-RN notified    Home Living Family/patient expects to be discharged to:: Private residence Living Arrangements: Spouse/significant other Available Help at Discharge: Family;Available 24 hours/day Type of Home: House Home Access: Stairs to enter Entrance Stairs-Rails: Right Entrance Stairs-Number of Steps: 1 Home Layout: Multi-level;Other (Comment) (split level home) Home Equipment: Other (comment);Cane - single point;Walker - standard      Prior Function Level of Independence: Independent         Comments: ADLs, IADLs, driving, gardening  Hand Dominance   Dominant Hand: Right    Extremity/Trunk Assessment   Upper Extremity Assessment Upper Extremity  Assessment: Defer to OT evaluation    Lower Extremity Assessment Lower Extremity Assessment: LLE deficits/detail LLE Deficits / Details: Hip flexion 4/5 LLE: Unable to fully assess due to pain       Communication   Communication: No difficulties  Cognition Arousal/Alertness: Awake/alert Behavior During Therapy: WFL for tasks assessed/performed Overall Cognitive Status: Within Functional Limits for tasks assessed                                        General Comments General comments (skin integrity, edema, etc.): Daughter Sheral Flow) and husband (Ed) present throughout session    Exercises Total Joint Exercises Ankle Circles/Pumps: AROM;Both;10 reps;Seated Quad Sets: Strengthening;Left;5 reps;Seated Heel Slides: AAROM;Left;5 reps;Seated Straight Leg Raises: AAROM;Left;5 reps;Seated   Assessment/Plan    PT Assessment Patient needs continued PT services  PT Problem List Decreased strength;Decreased mobility;Decreased range of motion;Decreased activity tolerance;Decreased balance;Decreased knowledge of use of DME;Decreased knowledge of precautions;Pain       PT Treatment Interventions DME instruction;Gait training;Stair training;Functional mobility training;Therapeutic activities;Therapeutic exercise;Balance training;Patient/family education    PT Goals (Current goals can be found in the Care Plan section)  Acute Rehab PT Goals Patient Stated Goal: Return home PT Goal Formulation: With patient Time For Goal Achievement: 04/19/17 Potential to Achieve Goals: Good    Frequency 7X/week   Barriers to discharge        Co-evaluation               AM-PAC PT "6 Clicks" Daily Activity  Outcome Measure Difficulty turning over in bed (including adjusting bedclothes, sheets and blankets)?: A Little Difficulty moving from lying on back to sitting on the side of the bed? : Unable Difficulty sitting down on and standing up from a chair with arms (e.g.,  wheelchair, bedside commode, etc,.)?: Unable Help needed moving to and from a bed to chair (including a wheelchair)?: A Little Help needed walking in hospital room?: A Little Help needed climbing 3-5 steps with a railing? : A Little 6 Click Score: 14    End of Session Equipment Utilized During Treatment: Gait belt Activity Tolerance: Patient tolerated treatment well;Patient limited by pain Patient left: in bed;in CPM;with call bell/phone within reach;with family/visitor present Nurse Communication: Mobility status PT Visit Diagnosis: Other abnormalities of gait and mobility (R26.89);Pain Pain - Right/Left: Left Pain - part of body: Knee    Time: 9417-4081 PT Time Calculation (min) (ACUTE ONLY): 30 min   Charges:   PT Evaluation $PT Eval Moderate Complexity: 1 Mod PT Treatments $Therapeutic Exercise: 8-22 mins   PT G Codes:       Ina Homes, PT, DPT [phone] Acute Rehab  Malachy Chamber 04/05/2017, 11:04 AM

## 2017-04-05 NOTE — Progress Notes (Signed)
Physical Therapy Treatment Patient Details Name: Claudia Rocha MRN: [id number] DOB: 22-Feb-1941 Today's Date: 04/05/2017    History of Present Illness Pt is a 76 y.o. female admitted for elective L TKA on 04/04/17. PMH including DM2, HTN, cataract extraction, and pulmonary embolism.     PT Comments    Pt performed increased mobility with increased time to complete tasks.  Gait speed is below normal at this time and indicative of furture fall.  Pt will require assistance 24 hour until this improves.  Plan next session to implement therapeutic exercise and increased gait distance.     Follow Up Recommendations  DC plan and follow up therapy as arranged by surgeon;Home health PT     Equipment Recommendations  Other (comment) (Youth heigh RW.  )    Recommendations for Other Services       Precautions / Restrictions Precautions Precautions: Knee Precaution Booklet Issued: No Precaution Comments: Reviewed knee precautions and resting with knee in ex Restrictions Weight Bearing Restrictions: Yes LLE Weight Bearing: Weight bearing as tolerated    Mobility  Bed Mobility Overal bed mobility: Needs Assistance Bed Mobility: Supine to Sit;Sit to Supine     Supine to sit: Min assist;HOB elevated;Mod assist     General bed mobility comments: Pt required assist to advance LEs, scoot hip forward and elevate trunk into sitting.    Transfers Overall transfer level: Needs assistance Equipment used: Rolling walker (2 wheeled) Transfers: Sit to/from Stand Sit to Stand: Min assist         General transfer comment: Cues for hand placement to and from seated surface.  Pt slow to ascend and descend.  Guarded due to pain.    Ambulation/Gait Ambulation/Gait assistance: Min guard Ambulation Distance (Feet): 60 Feet (+10 ft from bathroom back to recliner chair.  ) Assistive device: Rolling walker (2 wheeled) Gait Pattern/deviations: Step-to pattern;Antalgic;Decreased stride  length;Decreased weight shift to left;Decreased stance time - left;Decreased step length - right;Trunk flexed     General Gait Details: Amb in room with RW and min guard for balance; cues for technique with RW. Cues for upper trunk control and cues for R foot clearance.     Stairs            Wheelchair Mobility    Modified Rankin (Stroke Patients Only)       Balance Overall balance assessment: Needs assistance   Sitting balance-Leahy Scale: Good       Standing balance-Leahy Scale: Fair Standing balance comment: Able to stand with no UE support; reliant on BUE support for amb                            Cognition Arousal/Alertness: Awake/alert Behavior During Therapy: WFL for tasks assessed/performed Overall Cognitive Status: Within Functional Limits for tasks assessed                                        Exercises      General Comments        Pertinent Vitals/Pain Pain Assessment: 0-10 Pain Score: 7  Pain Location: L knee Pain Descriptors / Indicators: Constant;Discomfort;Grimacing Pain Intervention(s): Monitored during session;Repositioned;Ice applied    Home Living                      Prior Function  PT Goals (current goals can now be found in the care plan section) Acute Rehab PT Goals Patient Stated Goal: Return home Potential to Achieve Goals: Good Progress towards PT goals: Progressing toward goals    Frequency    7X/week      PT Plan Current plan remains appropriate    Co-evaluation              AM-PAC PT "6 Clicks" Daily Activity  Outcome Measure  Difficulty turning over in bed (including adjusting bedclothes, sheets and blankets)?: Unable Difficulty moving from lying on back to sitting on the side of the bed? : Unable Difficulty sitting down on and standing up from a chair with arms (e.g., wheelchair, bedside commode, etc,.)?: Unable Help needed moving to and from a bed to  chair (including a wheelchair)?: A Lot Help needed walking in hospital room?: A Little Help needed climbing 3-5 steps with a railing? : A Lot 6 Click Score: 10    End of Session Equipment Utilized During Treatment: Gait belt Activity Tolerance: Patient tolerated treatment well;Patient limited by pain Patient left: in chair;with family/visitor present;with call bell/phone within reach   PT Visit Diagnosis: Other abnormalities of gait and mobility (R26.89);Pain Pain - Right/Left: Left Pain - part of body: Knee     Time: 9629-5284 PT Time Calculation (min) (ACUTE ONLY): 45 min  Charges:  $Gait Training: 23-37 mins $Therapeutic Activity: 8-22 mins                    G Codes:       Joycelyn Rua, PTA pager [phone]    Florestine Avers 04/05/2017, 6:09 PM

## 2017-04-06 MED ORDER — OXYCODONE-ACETAMINOPHEN 5-325 MG PO TABS: 1.0000 | ORAL_TABLET | ORAL | 0 refills | Status: DC | PRN

## 2017-04-06 MED ORDER — RIVAROXABAN 10 MG PO TABS: 10.0000 mg | ORAL_TABLET | Freq: Every day | ORAL | 0 refills | Status: DC

## 2017-04-06 MED ORDER — METHOCARBAMOL 500 MG PO TABS: 500.0000 mg | ORAL_TABLET | Freq: Four times a day (QID) | ORAL | 1 refills | Status: DC | PRN

## 2017-04-06 NOTE — Progress Notes (Signed)
Daughter of pt at desk and is asking if patient can be discharged to home this PM. Advised daughter that I can notify MD on call ( Dr. August Saucer) and relay msg to her.  Daughter states that if paatient remains here that she will not be inclined to get up and move around. Daughter states patient will just lay in bed.  Home health has already been arranged and patient has DME at home.

## 2017-04-06 NOTE — Progress Notes (Signed)
Physical Therapy Treatment Patient Details Name: Claudia Rocha MRN: [id number] DOB: 01-Jan-1941 Today's Date: 04/06/2017    History of Present Illness Pt is a 76 y.o. female admitted for elective L TKA on 04/04/17. PMH including DM2, HTN, cataract extraction, and pulmonary embolism.     PT Comments    Pt performed gait and exercise during session.  Pt remains to severely lack flexion during session and is only able to achieve 44 degrees flexion.  Pt placed in CPM post session to encourage knee flexion.    Follow Up Recommendations  DC plan and follow up therapy as arranged by surgeon;Home health PT     Equipment Recommendations  Other (comment) (youth height RW)    Recommendations for Other Services       Precautions / Restrictions Precautions Precautions: Knee Precaution Booklet Issued: No Precaution Comments: Reviewed knee precautions and resting with knee in ex Restrictions Weight Bearing Restrictions: Yes LLE Weight Bearing: Weight bearing as tolerated    Mobility  Bed Mobility Overal bed mobility: Needs Assistance Bed Mobility: Sit to Supine     Supine to sit: HOB elevated;Mod assist Sit to supine: Min assist   General bed mobility comments: Pt able with heavy use of rail assist LEs into bed with min assistance.    Transfers Overall transfer level: Needs assistance Equipment used: Rolling walker (2 wheeled) Transfers: Sit to/from Stand Sit to Stand: Min assist         General transfer comment: Cues for hand placement to and from seated surface.  Pt slow to ascend and descend.  Guarded due to pain.    Ambulation/Gait Ambulation/Gait assistance: Min guard Ambulation Distance (Feet): 60 Feet Assistive device: Rolling walker (2 wheeled) Gait Pattern/deviations: Step-to pattern;Antalgic;Decreased stride length;Decreased weight shift to left;Decreased stance time - left;Decreased step length - right;Trunk flexed Gait velocity: Decreased Gait velocity  interpretation: Below normal speed for age/gender General Gait Details: Cues for sequencing and RW placement, min guard for safety patient required several rest breaks due to fatigue.  Constant cues for upper trunk control.  Remains to required cues for R foot clearance.     Stairs            Wheelchair Mobility    Modified Rankin (Stroke Patients Only)       Balance Overall balance assessment: Needs assistance Sitting-balance support: Feet supported;No upper extremity supported;Feet unsupported Sitting balance-Leahy Scale: Good     Standing balance support: During functional activity;No upper extremity supported Standing balance-Leahy Scale: Fair Standing balance comment: Able to stand with no UE support; reliant on BUE support for amb                            Cognition Arousal/Alertness: Lethargic Behavior During Therapy: [facility] for tasks assessed/performed Overall Cognitive Status: Within Functional Limits for tasks assessed                                        Exercises Total Joint Exercises Ankle Circles/Pumps: AROM;Both;10 reps;Seated Quad Sets: Strengthening;Left;5 reps;Seated Heel Slides: AAROM;Left;Seated;10 reps;PROM Goniometric ROM: 4- 44 degrees flexion in L knee.      General Comments General comments (skin integrity, edema, etc.): Pt daughter and husband present throughout session. Daughter is a Engineer, civil (consulting).      Pertinent Vitals/Pain Pain Assessment: 0-10 Pain Score: 7  Faces Pain Scale: Hurts even more Pain  Location: L knee Pain Descriptors / Indicators: Constant;Discomfort;Grimacing Pain Intervention(s): Monitored during session;Repositioned;Ice applied    Home Living                      Prior Function            PT Goals (current goals can now be found in the care plan section) Acute Rehab PT Goals Patient Stated Goal: Return home Potential to Achieve Goals: Good Progress towards PT goals: Progressing  toward goals    Frequency    7X/week      PT Plan Current plan remains appropriate    Co-evaluation              AM-PAC PT "6 Clicks" Daily Activity  Outcome Measure  Difficulty turning over in bed (including adjusting bedclothes, sheets and blankets)?: Unable Difficulty moving from lying on back to sitting on the side of the bed? : Unable Difficulty sitting down on and standing up from a chair with arms (e.g., wheelchair, bedside commode, etc,.)?: Unable Help needed moving to and from a bed to chair (including a wheelchair)?: A Little Help needed walking in hospital room?: A Little Help needed climbing 3-5 steps with a railing? : A Lot 6 Click Score: 11    End of Session Equipment Utilized During Treatment: Gait belt Activity Tolerance: Patient tolerated treatment well;Patient limited by pain Patient left: in chair;with family/visitor present;with call bell/phone within reach Nurse Communication: Mobility status PT Visit Diagnosis: Other abnormalities of gait and mobility (R26.89);Pain Pain - Right/Left: Left Pain - part of body: Knee     Time: 1122-1200 PT Time Calculation (min) (ACUTE ONLY): 38 min  Charges:  $Gait Training: 8-22 mins $Therapeutic Exercise: 8-22 mins $Therapeutic Activity: 8-22 mins                    G Codes:       Joycelyn Rua, PTA pager [phone]    Florestine Avers 04/06/2017, 12:10 PM

## 2017-04-06 NOTE — Progress Notes (Signed)
Subjective: 2 Days Post-Op Procedure(s) (LRB): LEFT TOTAL KNEE ARTHROPLASTY (Left) Patient reports pain as moderate.  No chest pain , dizziness or shortness of breath. Tolerating diet well.  Objective: Vital signs in last 24 hours: Temp:  [98.5 F (36.9 C)-99.1 F (37.3 C)] 99.1 F (37.3 C) (08/22 2117) Pulse Rate:  [63-90] 90 (08/23 0607) Resp:  [17-18] 17 (08/23 0607) BP: (148-152)/(48-50) 150/50 (08/23 0607) SpO2:  [97 %-99 %] 99 % (08/23 0607)  Intake/Output from previous day: 08/22 0701 - 08/23 0700 In: 120 [P.O.:120] Out: -  Intake/Output this shift: No intake/output data recorded.   Recent Labs  04/05/17 0330  HGB 9.5*    Recent Labs  04/05/17 0330  WBC 11.2*  RBC 3.17*  HCT 29.9*  PLT 158    Recent Labs  04/05/17 0330  NA 138  K 4.9  CL 105  CO2 27  BUN 22*  CREATININE 1.28*  GLUCOSE 182*  CALCIUM 8.7*   No results for input(s): LABPT, INR in the last 72 hours.  Left lower leg: Dorsiflexion/Plantar flexion intact Incision: dressing C/D/I Compartment soft  Assessment/Plan: 2 Days Post-Op Procedure(s) (LRB): LEFT TOTAL KNEE ARTHROPLASTY (Left) Up with therapy Monitor for symptoms of anemia  GILBERT CLARK 04/06/2017, 9:09 AM

## 2017-04-06 NOTE — Progress Notes (Signed)
Occupational Therapy Treatment Patient Details Name: Claudia Rocha MRN: [id number] DOB: September 23, 1940 Today's Date: 04/06/2017    History of present illness Pt is a 76 y.o. female admitted for elective L TKA on 04/04/17. PMH including DM2, HTN, cataract extraction, and pulmonary embolism.    OT comments  Pt progressing slowly towards acute OT goals. Focus of session was functional transfers. Pt reluctant to WB on LLE. Assistance needed for bed mobility. Slow guarded movements.  Education on LB AE given to pt and daughter.  Follow Up Recommendations  DC plan and follow up therapy as arranged by surgeon;Supervision/Assistance - 24 hour    Equipment Recommendations  None recommended by OT    Recommendations for Other Services      Precautions / Restrictions Precautions Precautions: Knee Precaution Comments: Reviewed knee precautions and resting with knee in ex Restrictions Weight Bearing Restrictions: Yes LLE Weight Bearing: Weight bearing as tolerated       Mobility Bed Mobility Overal bed mobility: Needs Assistance Bed Mobility: Supine to Sit     Supine to sit: HOB elevated;Mod assist     General bed mobility comments: Assist to advance LLE and to scoot hips to full EOB position. HOB elevated and used bed rails. Increased time and effort.   Transfers Overall transfer level: Needs assistance Equipment used: Rolling walker (2 wheeled) Transfers: Sit to/from Stand Sit to Stand: Min assist         General transfer comment: Cues for hand placement to and from seated surface.  Pt slow to ascend and descend.  Guarded due to pain.      Balance Overall balance assessment: Needs assistance Sitting-balance support: Feet supported;No upper extremity supported;Feet unsupported Sitting balance-Leahy Scale: Good     Standing balance support: During functional activity;No upper extremity supported Standing balance-Leahy Scale: Fair Standing balance comment: Able to stand with  no UE support; reliant on BUE support for amb                           ADL either performed or assessed with clinical judgement   ADL Overall ADL's : Needs assistance/impaired                                     Functional mobility during ADLs: Min guard;Rolling walker;Cueing for sequencing General ADL Comments: Pt completed bed mobility and mobility from EOB to recliner. LB AE education, DME and techniques for bed mobility and shower transfer discussed. Pt very guarded and slow with movement.      Vision       Perception     Praxis      Cognition Arousal/Alertness: Awake/alert Behavior During Therapy: WFL for tasks assessed/performed Overall Cognitive Status: Within Functional Limits for tasks assessed                                          Exercises     Shoulder Instructions       General Comments Pt daughter and husband present throughout session. Daughter is a Engineer, civil (consulting).    Pertinent Vitals/ Pain       Pain Assessment: Faces Faces Pain Scale: Hurts even more Pain Location: L knee Pain Descriptors / Indicators: Constant;Discomfort;Grimacing Pain Intervention(s): Limited activity within patient's tolerance;Monitored during session;Repositioned  Home Living  Prior Functioning/Environment              Frequency  Min 2X/week        Progress Toward Goals  OT Goals(current goals can now be found in the care plan section)  Progress towards OT goals: Progressing toward goals  Acute Rehab OT Goals Patient Stated Goal: Return home OT Goal Formulation: With patient Time For Goal Achievement: 04/19/17 Potential to Achieve Goals: Good ADL Goals Pt Will Perform Grooming: with set-up;with supervision;standing Pt Will Perform Lower Body Dressing: with min guard assist;sit to/from stand;with adaptive equipment Pt Will Transfer to Toilet: with set-up;with  supervision;bedside commode;ambulating Pt Will Perform Toileting - Clothing Manipulation and hygiene: with set-up;with supervision;sit to/from stand  Plan Discharge plan remains appropriate    Co-evaluation                 AM-PAC PT "6 Clicks" Daily Activity     Outcome Measure   Help from another person eating meals?: None Help from another person taking care of personal grooming?: A Little Help from another person toileting, which includes using toliet, bedpan, or urinal?: A Little Help from another person bathing (including washing, rinsing, drying)?: A Little Help from another person to put on and taking off regular upper body clothing?: None Help from another person to put on and taking off regular lower body clothing?: A Little 6 Click Score: 20    End of Session Equipment Utilized During Treatment: Gait belt;Rolling walker  OT Visit Diagnosis: Unsteadiness on feet (R26.81);Other abnormalities of gait and mobility (R26.89);Pain Pain - Right/Left: Left Pain - part of body: Knee   Activity Tolerance Patient tolerated treatment well   Patient Left in chair;with call bell/phone within reach;with family/visitor present   Nurse Communication Patient requests pain meds        Time: 3536-1443 OT Time Calculation (min): 26 min  Charges: OT General Charges $OT Visit: 1 Procedure OT Treatments $Self Care/Home Management : 23-37 mins     Pilar Grammes 04/06/2017, 11:08 AM

## 2017-04-06 NOTE — Progress Notes (Signed)
Physical Therapy Treatment Patient Details Name: Claudia Rocha MRN: [id number] DOB: [birth year] Today's Date: 04/06/2017    History of Present Illness Pt is a 76 y.o. female admitted for elective L TKA on 04/04/17. PMH including DM2, HTN, cataract extraction, and pulmonary embolism.    PT Comments    Pt slowly progressing with mobility. Only able to achieve 40-50' flexion, and remains limited by pain. Educ on therex, precautions, positioning, and continued CPM use to encourage knee flexion. Practiced transfers and amb in room. Will continue to follow acutely.   Follow Up Recommendations  DC plan and follow up therapy as arranged by surgeon;Home health PT     Equipment Recommendations  Other (comment) (youth height RW)    Recommendations for Other Services       Precautions / Restrictions Precautions Precautions: Knee Precaution Booklet Issued: No Precaution Comments: Reviewed knee precautions and resting with knee in ex Restrictions Weight Bearing Restrictions: Yes LLE Weight Bearing: Weight bearing as tolerated    Mobility  Bed Mobility Overal bed mobility: Needs Assistance Bed Mobility: Sit to Supine       Sit to supine: Mod assist   General bed mobility comments: ModA to assist BLEs into bed; pt educ on technique to use RLE to assist LLE into bed, but unable to achieve. Able to scoot up in bed with heavy use of BUEs  Transfers Overall transfer level: Needs assistance Equipment used: Rolling walker (2 wheeled) Transfers: Sit to/from Stand Sit to Stand: Min guard         General transfer comment: Cues for technique with RW; significant increased time required secondary to pain  Ambulation/Gait Ambulation/Gait assistance: Min guard Ambulation Distance (Feet): 15 Feet Assistive device: Rolling walker (2 wheeled) Gait Pattern/deviations: Step-to pattern;Antalgic;Decreased stride length;Decreased weight shift to left;Decreased stance time - left;Trunk  flexed Gait velocity: Decreased Gait velocity interpretation: <1.8 ft/sec, indicative of risk for recurrent falls General Gait Details: Min guard for safety; cues for sequencing with RW. Significant increased time and guarded gait secondary to pain with movement. Repeated cues to maintain upright posture   Stairs            Wheelchair Mobility    Modified Rankin (Stroke Patients Only)       Balance Overall balance assessment: Needs assistance Sitting-balance support: Feet supported;No upper extremity supported;Feet unsupported Sitting balance-Leahy Scale: Good     Standing balance support: During functional activity;No upper extremity supported Standing balance-Leahy Scale: Fair Standing balance comment: Able to stand with no UE support; reliant on BUE support for amb                            Cognition Arousal/Alertness: Awake/alert Behavior During Therapy: [facility] for tasks assessed/performed Overall Cognitive Status: Within Functional Limits for tasks assessed                                        Exercises Total Joint Exercises Ankle Circles/Pumps: AROM;Both;10 reps;Seated Quad Sets: Strengthening;Left;5 reps;Seated Heel Slides: AAROM;Left;Seated;10 reps Goniometric ROM: Grossly 5-40' L knee flexion    General Comments General comments (skin integrity, edema, etc.): Husband and daughter present throughout session      Pertinent Vitals/Pain Pain Assessment: Faces Faces Pain Scale: Hurts even more Pain Location: L knee Pain Descriptors / Indicators: Constant;Discomfort;Grimacing Pain Intervention(s): Limited activity within patient's tolerance;Monitored during session;Ice applied  Home Living                      Prior Function            PT Goals (current goals can now be found in the care plan section) Acute Rehab PT Goals Patient Stated Goal: Return home PT Goal Formulation: With patient Time For Goal  Achievement: 04/19/17 Potential to Achieve Goals: Good Progress towards PT goals: Progressing toward goals    Frequency    7X/week      PT Plan Current plan remains appropriate    Co-evaluation              AM-PAC PT "6 Clicks" Daily Activity  Outcome Measure  Difficulty turning over in bed (including adjusting bedclothes, sheets and blankets)?: Unable Difficulty moving from lying on back to sitting on the side of the bed? : Unable Difficulty sitting down on and standing up from a chair with arms (e.g., wheelchair, bedside commode, etc,.)?: Unable Help needed moving to and from a bed to chair (including a wheelchair)?: A Little Help needed walking in hospital room?: A Little Help needed climbing 3-5 steps with a railing? : A Lot 6 Click Score: 11    End of Session Equipment Utilized During Treatment: Gait belt Activity Tolerance: Patient tolerated treatment well;Patient limited by pain Patient left: in bed;in CPM;with call bell/phone within reach;with family/visitor present Nurse Communication: Mobility status PT Visit Diagnosis: Other abnormalities of gait and mobility (R26.89);Pain Pain - Right/Left: Left Pain - part of body: Knee     Time: 6962-9528 PT Time Calculation (min) (ACUTE ONLY): 23 min  Charges:  $Therapeutic Exercise: 8-22 mins $Therapeutic Activity: 8-22 mins                    G Codes:      Ina Homes, PT, DPT [id number] Acute Rehab  Malachy Chamber 04/06/2017, 5:04 PM

## 2017-04-07 NOTE — Progress Notes (Signed)
Physical Therapy Treatment Patient Details Name: Claudia Rocha MRN: [id number] DOB: September 02, 1940 Today's Date: 04/07/2017    History of Present Illness Pt is a 76 y.o. female admitted for elective L TKA on 04/04/17. PMH including DM2, HTN, cataract extraction, and pulmonary embolism.    PT Comments    Pt progressing with mobility, remains limited by increased pain with movement. Amb with RW and supervision; ascended/descended stairs with rail and supervision for safety. Reviewed precautions, positioning, importance of therex and mobility. Feel pt is safe to return home today with supervision and assist from family as needed; daughter and husband present throughout session and report she will have level of assist she needs initially. Would greatly benefit from HHPT as planned. Will continue to follow acutely to maximize functional mobility and independence.   Follow Up Recommendations  DC plan and follow up therapy as arranged by surgeon;Home health PT     Equipment Recommendations  Other (comment)    Recommendations for Other Services       Precautions / Restrictions Precautions Precautions: Knee Precaution Comments: Reviewed knee precautions Restrictions Weight Bearing Restrictions: Yes LLE Weight Bearing: Weight bearing as tolerated    Mobility  Bed Mobility               General bed mobility comments: Received sitting in chair  Transfers Overall transfer level: Needs assistance Equipment used: Rolling walker (2 wheeled) Transfers: Sit to/from Stand Sit to Stand: Supervision         General transfer comment: No cues or physical assist required standing with RW  Ambulation/Gait Ambulation/Gait assistance: Supervision Ambulation Distance (Feet): 50 Feet Assistive device: Rolling walker (2 wheeled) Gait Pattern/deviations: Step-through pattern;Antalgic;Trunk flexed;Decreased weight shift to left Gait velocity: Decreased Gait velocity interpretation: <1.8  ft/sec, indicative of risk for recurrent falls General Gait Details: Cues for knee flexion with amb; significant guarding secondary to pain with mobility. Supervision for safety; cues to maintain upright posture   Stairs Stairs: Yes   Stair Management: One rail Left;Step to pattern;Sideways;Backwards Number of Stairs: 3 General stair comments: Ascended 2 steps sideways with BUE support on L rail, and descended backwards with BUE support on L-rail; educ to pt and husband on technique, supervision for safety. Attempted to ascend with single UE support since pt does not have rails on some stairs at home, pt did not want to try this or the scoot method. Discussed with daughter option to ascend/descend with RW. Husband also mentioned adding a rail for pt to use  Wheelchair Mobility    Modified Rankin (Stroke Patients Only)       Balance                                            Cognition Arousal/Alertness: Awake/alert Behavior During Therapy: WFL for tasks assessed/performed Overall Cognitive Status: Within Functional Limits for tasks assessed                                        Exercises      General Comments        Pertinent Vitals/Pain Pain Assessment: Faces Faces Pain Scale: Hurts even more Pain Location: L knee Pain Descriptors / Indicators: Constant;Discomfort;Grimacing Pain Intervention(s): Limited activity within patient's tolerance;Monitored during session;Patient requesting pain meds-RN notified    Home  Living                      Prior Function            PT Goals (current goals can now be found in the care plan section) Acute Rehab PT Goals Patient Stated Goal: Return home PT Goal Formulation: With patient Time For Goal Achievement: 04/19/17 Potential to Achieve Goals: Good Progress towards PT goals: Progressing toward goals    Frequency    7X/week      PT Plan Current plan remains appropriate     Co-evaluation              AM-PAC PT "6 Clicks" Daily Activity  Outcome Measure  Difficulty turning over in bed (including adjusting bedclothes, sheets and blankets)?: A Lot Difficulty moving from lying on back to sitting on the side of the bed? : A Lot Difficulty sitting down on and standing up from a chair with arms (e.g., wheelchair, bedside commode, etc,.)?: A Lot Help needed moving to and from a bed to chair (including a wheelchair)?: A Little Help needed walking in hospital room?: A Little Help needed climbing 3-5 steps with a railing? : A Little 6 Click Score: 15    End of Session Equipment Utilized During Treatment: Gait belt Activity Tolerance: Patient tolerated treatment well;Patient limited by pain Patient left: in chair;with call bell/phone within reach;with family/visitor present Nurse Communication: Mobility status PT Visit Diagnosis: Other abnormalities of gait and mobility (R26.89);Pain Pain - Right/Left: Left Pain - part of body: Knee     Time: 1000-1025 PT Time Calculation (min) (ACUTE ONLY): 25 min  Charges:  $Gait Training: 23-37 mins                    G Codes:      Ina Homes, PT, DPT [phone] Acute Rehab  Malachy Chamber 04/07/2017, 10:34 AM

## 2017-04-07 NOTE — Progress Notes (Signed)
Patient ID: Claudia Rocha, female   DOB: Oct 04, 1940, 76 y.o.   MRN: [id number] Doing well overall.  Can be discharged to home today.  Knee stable.  Vitals stable.

## 2017-04-07 NOTE — Progress Notes (Signed)
All discharge instructions reviewed in detail with pt and her husband with understanding verbalized by both. Denies any questions or concerns prior to discharge. Discharged to home in stable condition with family.

## 2017-04-07 NOTE — Care Management Note (Signed)
Case Management Note  Patient Details  Name: Claudia Rocha MRN: [id number] Date of Birth: Jun 07, 1941  Subjective/Objective:   76 yr old female s/p left total knee arthroplasty.             Action/Plan: Case manager spoke with patient and her family concerning discharge plan. Patient daughter has arranged for [facility] to assist patient. She will have family support as well at discharge. All DME is at the home.    Expected Discharge Date:  04/07/17               Expected Discharge Plan:  Home w Home Health Services  In-House Referral:  NA  Discharge planning Services  CM Consult  Post Acute Care Choice:  Home Health Choice offered to:  Patient, Adult Children, Spouse  DME Arranged:   (HAs RW and 3in1) DME Agency:  NA  HH Arranged:  PT HH Agency:  [facility]  Status of Service:  Completed, signed off  If discussed at Long Length of Stay Meetings, dates discussed:    Additional Comments:  Durenda Guthrie, RN 04/07/2017, 10:00 AM

## 2017-04-07 NOTE — Care Management Important Message (Signed)
Important Message  Patient Details  Name: Claudia Rocha MRN: [id number] Date of Birth: 1941/06/03   Medicare Important Message Given:  Yes    Mayank Teuscher Abena 04/07/2017, 11:14 AM

## 2017-04-07 NOTE — Discharge Summary (Signed)
Patient ID: Claudia Rocha MRN: [id number] DOB/AGE: September 28, 1940 76 y.o.  Admit date: 04/04/2017 Discharge date: 04/07/2017  Admission Diagnoses:  Principal Problem:   Unilateral primary osteoarthritis, left knee Active Problems:   Status post total knee replacement, left   Discharge Diagnoses:  Same  Past Medical History:  Diagnosis Date  . Arthritis    spine  . Diabetes mellitus without complication (HCC)    type II  . Heart murmur    patient denies  . Hypertension    new onset   . Hypothyroidism   . Pulmonary embolism (HCC)    2016, was on eliquis immediately after, no longer on this  . Syncope     Surgeries: Procedure(s): LEFT TOTAL KNEE ARTHROPLASTY on 04/04/2017   Consultants:   Discharged Condition: Improved  Hospital Course: Claudia Rocha is an 76 y.o. female who was admitted 04/04/2017 for operative treatment ofUnilateral primary osteoarthritis, left knee. Patient has severe unremitting pain that affects sleep, daily activities, and work/hobbies. After pre-op clearance the patient was taken to the operating room on 04/04/2017 and underwent  Procedure(s): LEFT TOTAL KNEE ARTHROPLASTY.    Patient was given perioperative antibiotics: Anti-infectives    Start     Dose/Rate Route Frequency Ordered Stop   04/04/17 1945  ceFAZolin (ANCEF) IVPB 1 g/50 mL premix     1 g 100 mL/hr over 30 Minutes Intravenous Every 6 hours 04/04/17 1931 04/05/17 0129   04/04/17 1330  ceFAZolin (ANCEF) IVPB 2g/100 mL premix     2 g 200 mL/hr over 30 Minutes Intravenous On call to O.R. 04/03/17 1543 04/04/17 1353       Patient was given sequential compression devices, early ambulation, and chemoprophylaxis to prevent DVT.  Patient benefited maximally from hospital stay and there were no complications.    Recent vital signs: Patient Vitals for the past 24 hrs:  BP Temp Temp src Pulse Resp SpO2  04/07/17 0551 (!) 140/51 99.1 F (37.3 C) Oral 80 20 94 %  04/06/17 2220 128/61 98.5  F (36.9 C) Oral 76 18 96 %  04/06/17 2216 (!) 158/46 98.6 F (37 C) Oral 90 20 97 %  04/06/17 1300 (!) 139/48 98.5 F (36.9 C) Oral 74 - 97 %     Recent laboratory studies:  Recent Labs  04/05/17 0330  WBC 11.2*  HGB 9.5*  HCT 29.9*  PLT 158  NA 138  K 4.9  CL 105  CO2 27  BUN 22*  CREATININE 1.28*  GLUCOSE 182*  CALCIUM 8.7*     Discharge Medications:   Allergies as of 04/07/2017      Reactions   Diclofenac Sodium Hives      Medication List    STOP taking these medications   aspirin 81 MG tablet     TAKE these medications   BLOOD GLUCOSE TEST STRIPS Strp Check blood sugar as directed   CALCIUM 1200+D3 PO Take 1 tablet by mouth daily.   levothyroxine 100 MCG tablet Commonly known as:  SYNTHROID, LEVOTHROID TAKE 1 TABLET DAILY   losartan 50 MG tablet Commonly known as:  COZAAR TAKE 1 TABLET DAILY   methocarbamol 500 MG tablet Commonly known as:  ROBAXIN Take 1 tablet (500 mg total) by mouth every 6 (six) hours as needed for muscle spasms.   oxyCODONE-acetaminophen 5-325 MG tablet Commonly known as:  ROXICET Take 1-2 tablets by mouth every 4 (four) hours as needed for severe pain.   rivaroxaban 10 MG Tabs tablet Commonly known as:  XARELTO Take 1 tablet (10 mg total) by mouth daily with breakfast.   sitaGLIPtin 100 MG tablet Commonly known as:  JANUVIA TAKE 1 TABLET DAILY            Durable Medical Equipment        Start     Ordered   04/04/17 1932  DME 3 n 1  Once     04/04/17 1931   04/04/17 1932  DME Walker rolling  Once    Question:  Patient needs a walker to treat with the following condition  Answer:  Status post total knee replacement, left   04/04/17 1931       Discharge Care Instructions        Start     Ordered   04/07/17 0000  Discharge patient    Question Answer Comment  Discharge disposition 01-Home or Self Care   Discharge patient date 04/07/2017      04/07/17 0704   04/06/17 0000  methocarbamol (ROBAXIN)  500 MG tablet  Every 6 hours PRN     04/06/17 0915   04/06/17 0000  rivaroxaban (XARELTO) 10 MG TABS tablet  Daily with breakfast     04/06/17 0915   04/06/17 0000  oxyCODONE-acetaminophen (ROXICET) 5-325 MG tablet  Every 4 hours PRN     04/06/17 0915      Diagnostic Studies: Dg Knee Left Port  Result Date: 04/04/2017 CLINICAL DATA:  Post left total knee replacement EXAM: PORTABLE LEFT KNEE - 1-2 VIEW COMPARISON:  Left knee films of 03/13/2017 FINDINGS: The femoral and tibial components of the left total knee replacement are in good position and alignment. No complicating features are seen. There is some air in the soft tissues and joint space postoperatively. IMPRESSION: Left total knee replacement components in good position and alignment. No complicating features. Electronically Signed   By: Dwyane Dee M.D.   On: 04/04/2017 16:24   Xr Knee 1-2 Views Left  Result Date: 03/13/2017 AP lateral left knee: Tricompartmental changes with moderate severe medial compartmental arthritis moderate patellofemoral arthritis. No acute fractures no bony abnormalities otherwise.  Xr Knee 1-2 Views Right  Result Date: 03/13/2017 Tricompartmental changes with moderate severe medial compartmental arthritis moderate patellofemoral arthritis. Osteophytes off the lateral compartment both femur and tibia. No acute fractures no bony abnormalities otherwise.    Disposition: 01-Home or Self Care  Discharge Instructions    Discharge patient    Complete by:  As directed    Discharge disposition:  01-Home or Self Care   Discharge patient date:  04/07/2017      Follow-up Information    Kathryne Hitch, MD. Schedule an appointment as soon as possible for a visit in 2 week(s).   Specialty:  Orthopedic Surgery Contact information: [address] [place] [address] [phone]            Signed: Kathryne Hitch 04/07/2017, 7:04 AM

## 2017-04-07 NOTE — Progress Notes (Signed)
Occupational Therapy Treatment Patient Details Name: Claudia Rocha MRN: [id number] DOB: May 26, 1941 Today's Date: 04/07/2017    History of present illness Pt is a 76 y.o. female admitted for elective L TKA on 04/04/17. PMH including DM2, HTN, cataract extraction, and pulmonary embolism.    OT comments  Pt progressing towards goals, continues to demonstrate slow guarded movements, requiring MinGuard assist during room level functional mobility with RW. Min steady assist during toileting and MinA for sit<>stand at [facility]. Pt reports feeling comfortable completing ADLs after return home with family assist PRN. Questions answered throughout. Will continue to follow acutely to progress ADLs and functional mobility.    Follow Up Recommendations  DC plan and follow up therapy as arranged by surgeon;Supervision/Assistance - 24 hour    Equipment Recommendations  None recommended by OT          Precautions / Restrictions Precautions Precautions: Knee Precaution Comments: Reviewed knee precautions Restrictions Weight Bearing Restrictions: Yes LLE Weight Bearing: Weight bearing as tolerated       Mobility Bed Mobility               General bed mobility comments: Received sitting in chair  Transfers Overall transfer level: Needs assistance Equipment used: Rolling walker (2 wheeled) Transfers: Sit to/from Stand Sit to Stand: Min guard         General transfer comment: close guard for safety; requires increased time     Balance Overall balance assessment: Needs assistance Sitting-balance support: Feet supported;No upper extremity supported Sitting balance-Leahy Scale: Good     Standing balance support: Bilateral upper extremity supported;No upper extremity supported;During functional activity Standing balance-Leahy Scale: Fair Standing balance comment: Able to stand with no UE support with Min external assist while advancing pants over hips after toileting; reliant on BUE  support for amb                           ADL either performed or assessed with clinical judgement   ADL Overall ADL's : Needs assistance/impaired     Grooming: Set up;Sitting                 Lower Body Dressing Details (indicate cue type and reason): Pt reports feeling comfortable completing LB dressing using AE  Toilet Transfer: Minimal assistance;BSC;RW;Ambulation   Toileting- Clothing Manipulation and Hygiene: Minimal assistance;Sit to/from stand Toileting - Clothing Manipulation Details (indicate cue type and reason): MinA to steady in standing while Pt completes clothing management      Functional mobility during ADLs: Minimal assistance;Rolling walker General ADL Comments: Pt requires increased time to complete ADLs, slow guarded movements                Cognition Arousal/Alertness: Awake/alert Behavior During Therapy: WFL for tasks assessed/performed Overall Cognitive Status: Within Functional Limits for tasks assessed                                                      General Comments husband present during session     Pertinent Vitals/ Pain       Pain Assessment: Faces Faces Pain Scale: Hurts even more Pain Location: L knee Pain Descriptors / Indicators: Constant;Discomfort;Grimacing Pain Intervention(s): Monitored during session;Repositioned;Ice applied;Limited activity within patient's tolerance  Frequency  Min 2X/week        Progress Toward Goals  OT Goals(current goals can now be found in the care plan section)  Progress towards OT goals: Progressing toward goals  Acute Rehab OT Goals Patient Stated Goal: Return home OT Goal Formulation: With patient Time For Goal Achievement: 04/19/17 Potential to Achieve Goals: Good  Plan Discharge plan remains appropriate                     AM-PAC PT "6 Clicks" Daily  Activity     Outcome Measure   Help from another person eating meals?: None Help from another person taking care of personal grooming?: A Little Help from another person toileting, which includes using toliet, bedpan, or urinal?: A Little Help from another person bathing (including washing, rinsing, drying)?: A Little Help from another person to put on and taking off regular upper body clothing?: None Help from another person to put on and taking off regular lower body clothing?: A Little 6 Click Score: 20    End of Session Equipment Utilized During Treatment: Gait belt;Rolling walker CPM Left Knee CPM Left Knee: Off  OT Visit Diagnosis: Unsteadiness on feet (R26.81);Other abnormalities of gait and mobility (R26.89);Pain Pain - Right/Left: Left Pain - part of body: Knee   Activity Tolerance Patient tolerated treatment well   Patient Left in chair;with call bell/phone within reach;with family/visitor present   Nurse Communication Mobility status        Time: 4132-4401 OT Time Calculation (min): 17 min  Charges: OT General Charges $OT Visit: 1 Procedure OT Treatments $Self Care/Home Management : 8-22 mins  Marcy Siren, OT Pager [phone] 04/07/2017    Orlando Penner 04/07/2017, 11:40 AM

## 2017-04-10 ENCOUNTER — Telehealth (INDEPENDENT_AMBULATORY_CARE_PROVIDER_SITE_OTHER): Payer: Self-pay | Admitting: Radiology

## 2017-04-10 NOTE — Telephone Encounter (Signed)
Claudia Rocha is calling to let you know what he frequency would be for this pt.  It will be 1 week 1, 3 week 2. Please call him with any questions

## 2017-04-10 NOTE — Telephone Encounter (Signed)
FYI HHPT has arranged for patient to be evaluated.

## 2017-04-18 ENCOUNTER — Ambulatory Visit (INDEPENDENT_AMBULATORY_CARE_PROVIDER_SITE_OTHER): Payer: Medicare HMO | Admitting: Orthopaedic Surgery

## 2017-04-18 DIAGNOSIS — Z96652 Presence of left artificial knee joint: Secondary | ICD-10-CM

## 2017-04-18 NOTE — Progress Notes (Signed)
The patient is 2 weeks today status post a left total knee arthroplasty. She will work him with home therapy and does feel that she can proceed outpatient therapy after home health finishes with her by the end of this week. She says they've always been able to flex her back to about 70. She's not been taking much pain medicine. She's been on Xarelto as a blood thinner.  On exam her incision looks good. I placed new Steri-Strips. Her calf is soft. She has full extension to only about 65 of flexion.  I encouraged her about physical therapy and taught her how essentially visit she get her knee flexing. She understands this as well. [place] and [facility] [place]. I'll see her back in 4 weeks to see how she is doing overall.

## 2017-04-20 ENCOUNTER — Other Ambulatory Visit (INDEPENDENT_AMBULATORY_CARE_PROVIDER_SITE_OTHER): Payer: Self-pay

## 2017-04-20 DIAGNOSIS — Z96652 Presence of left artificial knee joint: Secondary | ICD-10-CM

## 2017-04-25 ENCOUNTER — Ambulatory Visit: Payer: Medicare HMO | Attending: Orthopaedic Surgery | Admitting: Physical Therapy

## 2017-04-25 ENCOUNTER — Encounter: Payer: Self-pay | Admitting: Physical Therapy

## 2017-04-25 DIAGNOSIS — R600 Localized edema: Secondary | ICD-10-CM | POA: Insufficient documentation

## 2017-04-25 DIAGNOSIS — G8929 Other chronic pain: Secondary | ICD-10-CM | POA: Diagnosis present

## 2017-04-25 DIAGNOSIS — M25662 Stiffness of left knee, not elsewhere classified: Secondary | ICD-10-CM

## 2017-04-25 DIAGNOSIS — M25562 Pain in left knee: Secondary | ICD-10-CM | POA: Insufficient documentation

## 2017-04-25 NOTE — Therapy (Signed)
[facility] [address] [place], [place], [address] Phone: [phone]   Fax:  [phone]  Physical Therapy Evaluation  Patient Details  Name: Claudia Rocha MRN: [id number] Date of Birth: 01-20-1941 Referring Provider: Doneen Poisson MD.  Encounter Date: 04/25/2017      PT End of Session - 04/25/17 1117    Visit Number 1   Number of Visits 24   Date for PT Re-Evaluation 06/24/17   PT Start Time 0900   PT Stop Time 0955   PT Time Calculation (min) 55 min   Activity Tolerance Patient tolerated treatment well;Patient limited by pain   Behavior During Therapy [facility] for tasks assessed/performed      Past Medical History:  Diagnosis Date  . Arthritis    spine  . Diabetes mellitus without complication (HCC)    type II  . Heart murmur    patient denies  . Hypertension    new onset   . Hypothyroidism   . Pulmonary embolism (HCC)    2016, was on eliquis immediately after, no longer on this  . Syncope     Past Surgical History:  Procedure Laterality Date  . ABDOMINAL HYSTERECTOMY    . CATARACT EXTRACTION    . TONSILLECTOMY    . TOTAL KNEE ARTHROPLASTY Left 04/04/2017   Procedure: LEFT TOTAL KNEE ARTHROPLASTY;  Surgeon: Kathryne Hitch, MD;  Location: [facility] OR;  Service: Orthopedics;  Laterality: Left;  . TUBAL LIGATION      There were no vitals filed for this visit.       Subjective Assessment - 04/25/17 1122    Subjective The patient underwent a left total kne replacement on 04/04/17.  She has home health physical therapy and is compliant to a HEP.  her pain is rated at a 4/10 today.  Movement increases pain and rest and ice decrease pain.     Pertinent History DM.   Patient Stated Goals Get out of pain and back to normal.   Currently in Pain? Yes   Pain Score 4    Pain Location Knee   Pain Orientation Left   Pain Descriptors / Indicators Throbbing   Pain Type Surgical pain   Pain Onset More than a month  ago   Pain Frequency Constant   Aggravating Factors  See above.   Pain Relieving Factors See above.   Multiple Pain Sites No            [facility] PT Assessment - 04/25/17 0001      Assessment   Medical Diagnosis Left total knee replacement.   Referring Provider Doneen Poisson MD.   Onset Date/Surgical Date --  04/04/17 (surgery date).   Next MD Visit --  05/22/17.     Precautions   Precautions --  No ultrasound.     Restrictions   Weight Bearing Restrictions No     Balance Screen   Has the patient fallen in the past 6 months No   Has the patient had a decrease in activity level because of a fear of falling?  No   Is the patient reluctant to leave their home because of a fear of falling?  No     Home Environment   Living Environment Private residence     Prior Function   Level of Independence Independent     Observation/Other Assessments   Observations Left knee incisional site with steri-strips intact.    Focus on Therapeutic Outcomes (FOTO)  --  52% limitation.  Observation/Other Assessments-Edema    Edema Circumferential     Circumferential Edema   Circumferential - Left  --  LT 3.5 cms > RT.     ROM / Strength   AROM / PROM / Strength AROM;Strength     AROM   Overall AROM Comments -15 degrees (active) to -10 degrees (passive) extension with flexion to 55 degrees (active) and 60 degrees (passive).     Strength   Overall Strength Comments left hip strength= 4 to 4+/5; and left knee strength= 4+/5.     Palpation   Palpation comment Diffuse left anterior knee tenderness.     Special Tests    Special Tests --  Moderate decrease in left pat mob all directions.     Ambulation/Gait   Gait Comments The patient is walking without an assistive device with her left knee held in some flexion throughout the gait cycle.            Objective measurements completed on examination: See above findings.          [facility] Adult PT Treatment/Exercise -  04/25/17 0001      Exercises   Exercises Knee/Hip     Knee/Hip Exercises: Aerobic   Nustep Level 2 x 8 minutes.     Modalities   Modalities Vasopneumatic     Vasopneumatic   Number Minutes Vasopneumatic  15 minutes   Vasopnuematic Location  --  Left knee.   Vasopneumatic Pressure Medium                  PT Short Term Goals - 04/25/17 1140      PT SHORT TERM GOAL #1   Title Independent with an initial HEP.   Time 2   Period Weeks   Status New     PT SHORT TERM GOAL #2   Title Full active left knee extension.   Time 2   Period Weeks   Status New     PT SHORT TERM GOAL #3   Title Active left knee flexion= 90 dgerees.   Time 2   Period Weeks   Status New           PT Long Term Goals - 04/25/17 1142      PT LONG TERM GOAL #1   Title Independent with an advanced HEP.   Time 8   Period Weeks   Status New     PT LONG TERM GOAL #2   Title Active left knee flexion to 115 degrees+ so the patient can perform functional tasks and do so with pain not > 2-3/10.   Time 8   Period Weeks   Status New     PT LONG TERM GOAL #3   Title Perform a reciprocating stair gait with one railing with pain not > 2-3/10.   Time 8   Period Weeks   Status New     PT LONG TERM GOAL #4   Title Perform ADL's with pain not > 2/10.   Time 8   Period Weeks   Status New     PT LONG TERM GOAL #5   Title Normal left patellar mobility.   Time 8   Period Weeks   Status New     PT LONG TERM GOAL #6   Title Normalize gait pattern.   Time 8   Period Weeks   Status New                Plan - 04/25/17 1134  Clinical Impression Statement The patient prsents to OPPT s/p left total knee replacement performed on 04/04/17.  She has significant restrictions with regards to ROM.   She has an expected amount of edema and her left patellar mobility is restricted.  Her deficits impair for functional mobility and ADL performance.  Patient will benefit from skilled physical  therapy.   History and Personal Factors relevant to plan of care: DM   Clinical Presentation Stable   Clinical Decision Making Low   Rehab Potential Good   PT Frequency 3x / week   PT Duration 8 weeks   PT Treatment/Interventions ADLs/Self Care Home Management;Cryotherapy;Electrical Stimulation;Gait training;Stair training;Neuromuscular re-education;Therapeutic exercise;Therapeutic activities;Functional mobility training;Patient/family education;Manual techniques;Passive range of motion;Vasopneumatic Device   PT Next Visit Plan TKA protocol.  Left patellar mobs.   Electrical stimulation and vasopneumatic.   Consulted and Agree with Plan of Care Patient      Patient will benefit from skilled therapeutic intervention in order to improve the following deficits and impairments:  Abnormal gait, Decreased activity tolerance, Decreased mobility, Decreased range of motion, Decreased strength, Increased edema, Pain  Visit Diagnosis: Chronic pain of left knee - Plan: PT plan of care cert/re-cert  Localized edema - Plan: PT plan of care cert/re-cert  Stiffness of left knee, not elsewhere classified - Plan: PT plan of care cert/re-cert      G-Codes - 04/25/17 1115    Functional Assessment Tool Used (Outpatient Only) FOTO....52% limitation.   Functional Limitation Mobility: Walking and moving around   Mobility: Walking and Moving Around Current Status [phone]([id number]) At least 40 percent but less than 60 percent impaired, limited or restricted   Mobility: Walking and Moving Around Goal Status [phone]([id number]) At least 20 percent but less than 40 percent impaired, limited or restricted       Problem List Patient Active Problem List   Diagnosis Date Noted  . Unilateral primary osteoarthritis, left knee 04/04/2017  . Status post total knee replacement, left 04/04/2017  . Acquired hypothyroidism 07/24/2014  . Carotid artery stenosis 09/12/2012  . Pounding in head 04/23/2012  . Hypersomnolence 04/23/2012  .  Syncope and collapse 12/27/2011  . Essential hypertension 12/27/2011    Abrahim Sargent, [place] MPT 04/25/2017, 11:47 AM  [facility] [address] [place], [place], [address] Phone: [phone]   Fax:  [phone]  Name: Claudia FudgeConnie R Rocha MRN: [id number] Date of Birth: Apr 02, 1941

## 2017-04-26 ENCOUNTER — Ambulatory Visit: Payer: Medicare HMO | Admitting: Physical Therapy

## 2017-04-26 ENCOUNTER — Encounter: Payer: Self-pay | Admitting: Physical Therapy

## 2017-04-26 DIAGNOSIS — R600 Localized edema: Secondary | ICD-10-CM

## 2017-04-26 DIAGNOSIS — M25562 Pain in left knee: Secondary | ICD-10-CM | POA: Diagnosis not present

## 2017-04-26 DIAGNOSIS — M25662 Stiffness of left knee, not elsewhere classified: Secondary | ICD-10-CM

## 2017-04-26 DIAGNOSIS — G8929 Other chronic pain: Secondary | ICD-10-CM

## 2017-04-26 NOTE — Therapy (Signed)
[facility] [address] [place], [place], [address] Phone: [phone]   Fax:  [phone]  Physical Therapy Treatment  Patient Details  Name: Claudia Rocha MRN: [id number] Date of Birth: 06/20/1941 Referring Provider: Doneen Poisson MD.  Encounter Date: 04/26/2017      PT End of Session - 04/26/17 1036    Visit Number 2   Number of Visits 24   Date for PT Re-Evaluation 06/24/17   Authorization Type G-CODE EVERY 10TH VISIT AND KX MODIFIER BEGINNING ON 15TH VISIT.   PT Start Time 1032   PT Stop Time 1120   PT Time Calculation (min) 48 min   Activity Tolerance Patient limited by pain   Behavior During Therapy [facility] for tasks assessed/performed      Past Medical History:  Diagnosis Date  . Arthritis    spine  . Diabetes mellitus without complication (HCC)    type II  . Heart murmur    patient denies  . Hypertension    new onset   . Hypothyroidism   . Pulmonary embolism (HCC)    2016, was on eliquis immediately after, no longer on this  . Syncope     Past Surgical History:  Procedure Laterality Date  . ABDOMINAL HYSTERECTOMY    . CATARACT EXTRACTION    . TONSILLECTOMY    . TOTAL KNEE ARTHROPLASTY Left 04/04/2017   Procedure: LEFT TOTAL KNEE ARTHROPLASTY;  Surgeon: Kathryne Hitch, MD;  Location: [facility] OR;  Service: Orthopedics;  Laterality: Left;  . TUBAL LIGATION      There were no vitals filed for this visit.      Subjective Assessment - 04/26/17 1032    Subjective Reports that her knee is bothering her some.   Pertinent History DM.   Patient Stated Goals Get out of pain and back to normal.   Currently in Pain? Yes   Pain Score 2    Pain Location Knee   Pain Orientation Left   Pain Descriptors / Indicators Discomfort   Pain Type Surgical pain   Pain Onset More than a month ago            [facility] PT Assessment - 04/26/17 0001      Assessment   Medical Diagnosis Left total knee replacement.    Onset Date/Surgical Date 04/04/17   Next MD Visit 05/22/2017                     [facility] Adult PT Treatment/Exercise - 04/26/17 0001      Knee/Hip Exercises: Aerobic   Nustep Level 4 x 8 minutes.     Modalities   Modalities Electrical Stimulation;Vasopneumatic;Moist Heat     Moist Heat Therapy   Number Minutes Moist Heat 15 Minutes   Moist Heat Location Other (comment)  L thigh during PROM to reduce tightness and pain     Electrical Stimulation   Electrical Stimulation Location L knee   Electrical Stimulation Action IFC   Electrical Stimulation Parameters 1-10 hz x15 min   Electrical Stimulation Goals Pain;Edema;Tone     Vasopneumatic   Number Minutes Vasopneumatic  15 minutes   Vasopnuematic Location  Knee   Vasopneumatic Pressure Medium   Vasopneumatic Temperature  34     Manual Therapy   Manual Therapy Passive ROM;Soft tissue mobilization   Soft tissue mobilization STW to L Quad, ITB, gastroc to reduce tone and pain   Passive ROM PROM of L knee into flexion gently to improve ROM as  well as passive L gastroc, HS and ITB stretches                   PT Short Term Goals - 04/25/17 1140      PT SHORT TERM GOAL #1   Title Independent with an initial HEP.   Time 2   Period Weeks   Status New     PT SHORT TERM GOAL #2   Title Full active left knee extension.   Time 2   Period Weeks   Status New     PT SHORT TERM GOAL #3   Title Active left knee flexion= 90 dgerees.   Time 2   Period Weeks   Status New           PT Long Term Goals - 04/25/17 1142      PT LONG TERM GOAL #1   Title Independent with an advanced HEP.   Time 8   Period Weeks   Status New     PT LONG TERM GOAL #2   Title Active left knee flexion to 115 degrees+ so the patient can perform functional tasks and do so with pain not > 2-3/10.   Time 8   Period Weeks   Status New     PT LONG TERM GOAL #3   Title Perform a reciprocating stair gait with one railing with  pain not > 2-3/10.   Time 8   Period Weeks   Status New     PT LONG TERM GOAL #4   Title Perform ADL's with pain not > 2/10.   Time 8   Period Weeks   Status New     PT LONG TERM GOAL #5   Title Normal left patellar mobility.   Time 8   Period Weeks   Status New     PT LONG TERM GOAL #6   Title Normalize gait pattern.   Time 8   Period Weeks   Status New               Plan - 04/26/17 1158    Clinical Impression Statement Patient arrived to treatment with reports of L Quad cramping which did not alleviate with Nustep. Manual therapy required to reduce pain enough for patient to ambulate to plinth table. Tightness and TPs palpated along L quad, ITB, medial HS and gastroc. Limited L knee ROM secondary to guarding and muscle tightness thus moist heat applied to L thigh to reduce cramping and pain enough to complete ROM. Passive L HS, gastroc and ITB stretching completed with gentle stroking of muscles stretched to reduce pain. Patient encouraged to ice multiple times a day for 15-20 minutes at a time and ambulate frequently to reduce pain. Normal modalities response noted following removal of the modalities.   Rehab Potential Good   PT Frequency 3x / week   PT Duration 8 weeks   PT Treatment/Interventions ADLs/Self Care Home Management;Cryotherapy;Electrical Stimulation;Gait training;Stair training;Neuromuscular re-education;Therapeutic exercise;Therapeutic activities;Functional mobility training;Patient/family education;Manual techniques;Passive range of motion;Vasopneumatic Device   PT Next Visit Plan TKA protocol.  Left patellar mobs.   Electrical stimulation and vasopneumatic.   Consulted and Agree with Plan of Care Patient      Patient will benefit from skilled therapeutic intervention in order to improve the following deficits and impairments:  Abnormal gait, Decreased activity tolerance, Decreased mobility, Decreased range of motion, Decreased strength, Increased edema,  Pain  Visit Diagnosis: Chronic pain of left knee  Localized edema  Stiffness of left  knee, not elsewhere classified       G-Codes - 2017-05-25 1115    Functional Assessment Tool Used (Outpatient Only) FOTO....52% limitation.   Functional Limitation Mobility: Walking and moving around   Mobility: Walking and Moving Around Current Status [phone]) At least 40 percent but less than 60 percent impaired, limited or restricted   Mobility: Walking and Moving Around Goal Status [phone]) At least 20 percent but less than 40 percent impaired, limited or restricted      Problem List Patient Active Problem List   Diagnosis Date Noted  . Unilateral primary osteoarthritis, left knee 04/04/2017  . Status post total knee replacement, left 04/04/2017  . Acquired hypothyroidism 07/24/2014  . Carotid artery stenosis 09/12/2012  . Pounding in head 04/23/2012  . Hypersomnolence 04/23/2012  . Syncope and collapse 12/27/2011  . Essential hypertension 12/27/2011    Evelene Croon, PTA 04/26/2017, 12:03 PM  [facility] [address] [place], [place], [address] Phone: [phone]   Fax:  [phone]  Name: Claudia Rocha MRN: [id number] Date of Birth: 1940/10/12

## 2017-04-28 ENCOUNTER — Ambulatory Visit: Payer: Medicare HMO | Admitting: Physical Therapy

## 2017-04-28 DIAGNOSIS — M25562 Pain in left knee: Principal | ICD-10-CM

## 2017-04-28 DIAGNOSIS — M25662 Stiffness of left knee, not elsewhere classified: Secondary | ICD-10-CM

## 2017-04-28 DIAGNOSIS — G8929 Other chronic pain: Secondary | ICD-10-CM

## 2017-04-28 DIAGNOSIS — R600 Localized edema: Secondary | ICD-10-CM

## 2017-04-28 NOTE — Therapy (Signed)
[facility] [address] [place], [place], [address] Phone: [phone]   Fax:  [phone]  Physical Therapy Treatment  Patient Details  Name: Claudia Rocha MRN: [id number] Date of Birth: October 03, 1940 Referring Provider: Doneen Poisson MD.  Encounter Date: 04/28/2017      PT End of Session - 04/28/17 0830    Visit Number 3   Number of Visits 24   Date for PT Re-Evaluation 06/24/17   Authorization Type G-CODE EVERY 10TH VISIT AND KX MODIFIER BEGINNING ON 15TH VISIT.   PT Start Time 0818   PT Stop Time 0913   PT Time Calculation (min) 55 min   Activity Tolerance Patient tolerated treatment well   Behavior During Therapy [facility] for tasks assessed/performed      Past Medical History:  Diagnosis Date  . Arthritis    spine  . Diabetes mellitus without complication (HCC)    type II  . Heart murmur    patient denies  . Hypertension    new onset   . Hypothyroidism   . Pulmonary embolism (HCC)    2016, was on eliquis immediately after, no longer on this  . Syncope     Past Surgical History:  Procedure Laterality Date  . ABDOMINAL HYSTERECTOMY    . CATARACT EXTRACTION    . TONSILLECTOMY    . TOTAL KNEE ARTHROPLASTY Left 04/04/2017   Procedure: LEFT TOTAL KNEE ARTHROPLASTY;  Surgeon: Kathryne Hitch, MD;  Location: [facility] OR;  Service: Orthopedics;  Laterality: Left;  . TUBAL LIGATION      There were no vitals filed for this visit.      Subjective Assessment - 04/28/17 0904    Subjective Reports that she went to orthopedic and he sent her for ultrasound to check for blood clot.    Pertinent History DM.   Patient Stated Goals Get out of pain and back to normal.   Currently in Pain? Yes   Pain Score 2    Pain Location Knee   Pain Orientation Left   Pain Descriptors / Indicators Sore   Pain Type Surgical pain   Pain Onset More than a month ago            [facility] PT Assessment - 04/28/17 0001      Assessment    Medical Diagnosis Left total knee replacement.   Onset Date/Surgical Date 04/04/17   Next MD Visit 05/22/2017     Restrictions   Weight Bearing Restrictions No     Observation/Other Assessments-Edema    Edema Circumferential     Circumferential Edema   Circumferential - Right 46.6 cm   Circumferential - Left  48.8 cm                     [facility] Adult PT Treatment/Exercise - 04/28/17 0001      Knee/Hip Exercises: Aerobic   Nustep L4 x12 min     Knee/Hip Exercises: Standing   Forward Lunges Left;2 sets;10 reps;3 seconds   Hip Abduction AROM;Left;2 sets;10 reps;Knee straight   Rocker Board 2 minutes     Modalities   Modalities Proofreader Location L knee   Electrical Stimulation Action IFC   Electrical Stimulation Parameters 1-10 hz x15 min   Electrical Stimulation Goals Pain;Edema;Tone     Vasopneumatic   Number Minutes Vasopneumatic  15 minutes   Vasopnuematic Location  Knee   Vasopneumatic Pressure Medium   Vasopneumatic Temperature  68     Manual Therapy   Manual Therapy Passive ROM   Passive ROM PROM of L knee into flexion,extension gently to improve ROM as well as passive L HS and ITB stretches                   PT Short Term Goals - 04/25/17 1140      PT SHORT TERM GOAL #1   Title Independent with an initial HEP.   Time 2   Period Weeks   Status New     PT SHORT TERM GOAL #2   Title Full active left knee extension.   Time 2   Period Weeks   Status New     PT SHORT TERM GOAL #3   Title Active left knee flexion= 90 dgerees.   Time 2   Period Weeks   Status New           PT Long Term Goals - 04/25/17 1142      PT LONG TERM GOAL #1   Title Independent with an advanced HEP.   Time 8   Period Weeks   Status New     PT LONG TERM GOAL #2   Title Active left knee flexion to 115 degrees+ so the patient can perform functional tasks and do so with  pain not > 2-3/10.   Time 8   Period Weeks   Status New     PT LONG TERM GOAL #3   Title Perform a reciprocating stair gait with one railing with pain not > 2-3/10.   Time 8   Period Weeks   Status New     PT LONG TERM GOAL #4   Title Perform ADL's with pain not > 2/10.   Time 8   Period Weeks   Status New     PT LONG TERM GOAL #5   Title Normal left patellar mobility.   Time 8   Period Weeks   Status New     PT LONG TERM GOAL #6   Title Normalize gait pattern.   Time 8   Period Weeks   Status New               Plan - 04/28/17 0909    Clinical Impression Statement Patient presented in clinic with no reports of any L Quad cramping or increased pain. Patient able to complete longer duration on Nustep as well as other low level L knee and hip exercises without complaint. Patient still very limited with L knee ROM at this time both actively and passively. Passive L HS and ITB stretches completed today with no complaints from patient today. Normal modalities response noted following removal of the modalities. Patient educated to continue icing every 3-4 hours for 15-20 minutes at a time.   Rehab Potential Good   PT Frequency 3x / week   PT Duration 8 weeks   PT Treatment/Interventions ADLs/Self Care Home Management;Cryotherapy;Electrical Stimulation;Gait training;Stair training;Neuromuscular re-education;Therapeutic exercise;Therapeutic activities;Functional mobility training;Patient/family education;Manual techniques;Passive range of motion;Vasopneumatic Device   PT Next Visit Plan TKA protocol.  Left patellar mobs.   Electrical stimulation and vasopneumatic.   Consulted and Agree with Plan of Care Patient      Patient will benefit from skilled therapeutic intervention in order to improve the following deficits and impairments:  Abnormal gait, Decreased activity tolerance, Decreased mobility, Decreased range of motion, Decreased strength, Increased edema, Pain  Visit  Diagnosis: Chronic pain of left knee  Localized edema  Stiffness of  left knee, not elsewhere classified     Problem List Patient Active Problem List   Diagnosis Date Noted  . Unilateral primary osteoarthritis, left knee 04/04/2017  . Status post total knee replacement, left 04/04/2017  . Acquired hypothyroidism 07/24/2014  . Carotid artery stenosis 09/12/2012  . Pounding in head 04/23/2012  . Hypersomnolence 04/23/2012  . Syncope and collapse 12/27/2011  . Essential hypertension 12/27/2011    Evelene Croon, PTA 04/28/2017, 9:47 AM  [facility] [address] [place], [place], [address] Phone: [phone]   Fax:  [phone]  Name: KENDRAH LOVERN MRN: [id number] Date of Birth: 23-Jan-1941

## 2017-05-01 ENCOUNTER — Ambulatory Visit: Payer: Medicare HMO | Admitting: Physical Therapy

## 2017-05-01 ENCOUNTER — Encounter: Payer: Self-pay | Admitting: Physical Therapy

## 2017-05-01 DIAGNOSIS — M25562 Pain in left knee: Principal | ICD-10-CM

## 2017-05-01 DIAGNOSIS — G8929 Other chronic pain: Secondary | ICD-10-CM

## 2017-05-01 DIAGNOSIS — R600 Localized edema: Secondary | ICD-10-CM

## 2017-05-01 DIAGNOSIS — M25662 Stiffness of left knee, not elsewhere classified: Secondary | ICD-10-CM

## 2017-05-01 NOTE — Therapy (Signed)
[facility] [address] [place], [place], [address] Phone: [phone]   Fax:  [phone]  Physical Therapy Treatment  Patient Details  Name: Claudia Rocha MRN: [id number] Date of Birth: October 08, 1940 Referring Provider: Doneen Poisson MD.  Encounter Date: 05/01/2017      PT End of Session - 05/01/17 0902    Visit Number 4   Number of Visits 24   Date for PT Re-Evaluation 06/24/17   Authorization Type G-CODE EVERY 10TH VISIT AND KX MODIFIER BEGINNING ON 15TH VISIT.   PT Start Time 0815   PT Stop Time 0910   PT Time Calculation (min) 55 min   Activity Tolerance Patient tolerated treatment well   Behavior During Therapy [facility] for tasks assessed/performed      Past Medical History:  Diagnosis Date  . Arthritis    spine  . Diabetes mellitus without complication (HCC)    type II  . Heart murmur    patient denies  . Hypertension    new onset   . Hypothyroidism   . Pulmonary embolism (HCC)    2016, was on eliquis immediately after, no longer on this  . Syncope     Past Surgical History:  Procedure Laterality Date  . ABDOMINAL HYSTERECTOMY    . CATARACT EXTRACTION    . TONSILLECTOMY    . TOTAL KNEE ARTHROPLASTY Left 04/04/2017   Procedure: LEFT TOTAL KNEE ARTHROPLASTY;  Surgeon: Kathryne Hitch, MD;  Location: [facility] OR;  Service: Orthopedics;  Laterality: Left;  . TUBAL LIGATION      There were no vitals filed for this visit.      Subjective Assessment - 05/01/17 0819    Subjective Patient reported some discomfort and stiffness today   Pertinent History DM.   Patient Stated Goals Get out of pain and back to normal.   Currently in Pain? Yes   Pain Score 2    Pain Location Knee   Pain Orientation Left   Pain Descriptors / Indicators Sore   Pain Type Surgical pain   Pain Onset More than a month ago   Pain Frequency Constant   Aggravating Factors  ROM or activity   Pain Relieving Factors at rest             [facility] PT Assessment - 05/01/17 0001      ROM / Strength   AROM / PROM / Strength AROM;PROM     AROM   AROM Assessment Site Knee   Right/Left Knee Left   Left Knee Extension -10   Left Knee Flexion 55     PROM   PROM Assessment Site Knee   Right/Left Knee Left   Left Knee Extension -6   Left Knee Flexion 60                     [facility] Adult PT Treatment/Exercise - 05/01/17 0001      Knee/Hip Exercises: Aerobic   Nustep L4 x13 min, adjusted for ROM     Electrical Stimulation   Electrical Stimulation Location L knee   Electrical Stimulation Action IFC   Electrical Stimulation Parameters 1-10hz  x46min   Electrical Stimulation Goals Pain;Edema;Tone     Vasopneumatic   Number Minutes Vasopneumatic  15 minutes   Vasopnuematic Location  Knee   Vasopneumatic Pressure Medium     Manual Therapy   Manual Therapy Passive ROM   Passive ROM PROM of L knee into flexion,extension gently to improve ROM , flexion in supine  and sitting with low duration holds, patella mobs in all directions                PT Education - 05/01/17 0856    Education provided Yes   Education Details HEP   Person(s) Educated Patient   Methods Explanation;Demonstration;Handout   Comprehension Verbalized understanding;Returned demonstration          PT Short Term Goals - 05/01/17 0856      PT SHORT TERM GOAL #1   Title Independent with an initial HEP.   Time 2   Period Weeks   Status On-going     PT SHORT TERM GOAL #2   Title Full active left knee extension.   Period Weeks   Status On-going     PT SHORT TERM GOAL #3   Title Active left knee flexion= 90 dgerees.   Time 2   Period Weeks   Status On-going           PT Long Term Goals - 05/01/17 0856      PT LONG TERM GOAL #1   Title Independent with an advanced HEP.   Time 8   Period Weeks   Status On-going     PT LONG TERM GOAL #2   Title Active left knee flexion to 115 degrees+ so the patient  can perform functional tasks and do so with pain not > 2-3/10.   Time 8   Period Weeks   Status On-going     PT LONG TERM GOAL #3   Title Perform a reciprocating stair gait with one railing with pain not > 2-3/10.   Time 8   Period Weeks   Status On-going     PT LONG TERM GOAL #4   Title Perform ADL's with pain not > 2/10.   Period Weeks   Status On-going     PT LONG TERM GOAL #5   Title Normal left patellar mobility.   Time 8   Period Weeks   Status On-going     PT LONG TERM GOAL #6   Title Normalize gait pattern.   Time 8   Period Weeks   Status On-going               Plan - 05/01/17 1610    Clinical Impression Statement Patient tolerated treatment fair today. Today focused on ROM for left knee flexion and ext, mostly flexion due to increased tightness in knee. Patient improved with ext ROM only today. Patient was given HEP for self stretches and educated on frequency and rest ice and elevation after. Current goals ongoing.    Rehab Potential Good   PT Frequency 3x / week   PT Duration 8 weeks   PT Treatment/Interventions ADLs/Self Care Home Management;Cryotherapy;Electrical Stimulation;Gait training;Stair training;Neuromuscular re-education;Therapeutic exercise;Therapeutic activities;Functional mobility training;Patient/family education;Manual techniques;Passive range of motion;Vasopneumatic Device   PT Next Visit Plan TKA protocol.  Left patellar mobs.   Electrical stimulation and vasopneumatic.   Consulted and Agree with Plan of Care Patient      Patient will benefit from skilled therapeutic intervention in order to improve the following deficits and impairments:  Abnormal gait, Decreased activity tolerance, Decreased mobility, Decreased range of motion, Decreased strength, Increased edema, Pain  Visit Diagnosis: Chronic pain of left knee  Localized edema  Stiffness of left knee, not elsewhere classified     Problem List Patient Active Problem List    Diagnosis Date Noted  . Unilateral primary osteoarthritis, left knee 04/04/2017  . Status post total  knee replacement, left 04/04/2017  . Acquired hypothyroidism 07/24/2014  . Carotid artery stenosis 09/12/2012  . Pounding in head 04/23/2012  . Hypersomnolence 04/23/2012  . Syncope and collapse 12/27/2011  . Essential hypertension 12/27/2011    Hermelinda Dellen, PTA 05/01/2017, 9:11 AM  [facility] [address] [place], [place], [address] Phone: [phone]   Fax:  [phone]  Name: Claudia Rocha MRN: [id number] Date of Birth: 1941-03-15

## 2017-05-01 NOTE — Patient Instructions (Signed)
Knee Extension Mobilization: Towel Prop   With rolled towel under right ankle, place _1-5___ pound weight across knee. Hold __5+__ minutes. Repeat __2-3__ times per set. Do __2__ sets per session. Do __2-4__ sessions per day.     Knee Flexion Stretch on Step  Place foot on step and lean forward until you feel a good stretch in front of knee.   hold 30 sec x 5-10 perform 2-4 x daily  Chair Knee Flexion   Keeping feet on floor, slide foot of operated leg back, bending knee. Hold ___30_ seconds. Repeat __5__ times. Do __2-4__ sessions a day.  Heel Slide   Bend left knee and pull heel toward buttocks. Use strap around foot and pull strap with arms to assist knee to bend further. Hold 10 secs.  Repeat _10___ times. Do __4__ sessions per day.

## 2017-05-03 ENCOUNTER — Encounter: Payer: Self-pay | Admitting: Physical Therapy

## 2017-05-03 ENCOUNTER — Ambulatory Visit: Payer: Medicare HMO | Admitting: Physical Therapy

## 2017-05-03 DIAGNOSIS — M25662 Stiffness of left knee, not elsewhere classified: Secondary | ICD-10-CM

## 2017-05-03 DIAGNOSIS — G8929 Other chronic pain: Secondary | ICD-10-CM

## 2017-05-03 DIAGNOSIS — M25562 Pain in left knee: Principal | ICD-10-CM

## 2017-05-03 DIAGNOSIS — R600 Localized edema: Secondary | ICD-10-CM

## 2017-05-03 NOTE — Therapy (Signed)
[facility] [address] [place], [place], [address] Phone: [phone]   Fax:  [phone]  Physical Therapy Treatment  Patient Details  Name: Claudia Rocha MRN: [id number] Date of Birth: [birth year] Referring Provider: Doneen Poisson MD.  Encounter Date: 05/03/2017      PT End of Session - 05/03/17 0909    Visit Number 5   Number of Visits 24   Date for PT Re-Evaluation 06/24/17   Authorization Type G-CODE EVERY 10TH VISIT AND KX MODIFIER BEGINNING ON 15TH VISIT.   PT Start Time 0900   PT Stop Time 0955   PT Time Calculation (min) 55 min   Activity Tolerance Patient tolerated treatment well   Behavior During Therapy WFL for tasks assessed/performed      Past Medical History:  Diagnosis Date  . Arthritis    spine  . Diabetes mellitus without complication (HCC)    type II  . Heart murmur    patient denies  . Hypertension    new onset   . Hypothyroidism   . Pulmonary embolism (HCC)    2016, was on eliquis immediately after, no longer on this  . Syncope     Past Surgical History:  Procedure Laterality Date  . ABDOMINAL HYSTERECTOMY    . CATARACT EXTRACTION    . TONSILLECTOMY    . TOTAL KNEE ARTHROPLASTY Left 04/04/2017   Procedure: LEFT TOTAL KNEE ARTHROPLASTY;  Surgeon: Kathryne Hitch, MD;  Location: [facility] OR;  Service: Orthopedics;  Laterality: Left;  . TUBAL LIGATION      There were no vitals filed for this visit.      Subjective Assessment - 05/03/17 0907    Subjective Patient reported pain today of 2/10. pt reported she took a flexeril and oxycodone prior to therapy.    Pertinent History DM.   Patient Stated Goals Get out of pain and back to normal.   Currently in Pain? Yes   Pain Score 2    Pain Location Knee   Pain Orientation Left   Pain Descriptors / Indicators Sore   Pain Onset More than a month ago   Pain Frequency Constant   Aggravating Factors  ROM, activity, bending   Pain  Relieving Factors at rest            [facility] PT Assessment - 05/03/17 0001      Restrictions   Weight Bearing Restrictions No     Circumferential Edema   Circumferential - Left  49.5 centimeters     AROM   AROM Assessment Site Knee   Right/Left Knee Left   Left Knee Extension 10  10 degrees of flexion, unable to reach full knee extension   Left Knee Flexion 60     PROM   PROM Assessment Site Knee   Right/Left Knee Left   Left Knee Extension 8   Left Knee Flexion 70                     [facility] Adult PT Treatment/Exercise - 05/03/17 0001      Knee/Hip Exercises: Stretches   Active Hamstring Stretch Left;3 reps;30 seconds   Other Knee/Hip Stretches IT band stretching x 30 holding 30 seconds each on left LE     Knee/Hip Exercises: Aerobic   Nustep L4 x 10 minutes     Knee/Hip Exercises: Standing   Forward Lunges Left;2 sets;10 reps     Knee/Hip Exercises: Supine   Quad Sets AROM;Strengthening;Left;20 reps   [employer]  Sets Limitations with overpressure holding 5 seconds each   Short Arc Quad Sets Left;15 reps   Heel Slides AAROM;Left;20 reps   Heel Slides Limitations with towel under heel to assist with slides   Patellar Mobs limited by edema with inferior glides     Modalities   Modalities Electrical Stimulation;Vasopneumatic     Electrical Stimulation   Electrical Stimulation Location L knee   Electrical Stimulation Action IFC   Electrical Stimulation Parameters 1-10 Hz x 15 minutes   Electrical Stimulation Goals Edema;Pain     Vasopneumatic   Number Minutes Vasopneumatic  --  device came unplugged during treatment   Vasopnuematic Location  --   Vasopneumatic Pressure --     Manual Therapy   Manual Therapy Passive ROM   Passive ROM PROM of L knee into flexion,extension gently to improve ROM , flexion in supine and sitting with low duration holds, patella mobs in all directions                PT Education - 05/03/17 0908    Education  Details Prone knee hangs and hamstring curls with assist from sheet   Person(s) Educated Patient   Methods Explanation;Demonstration;Verbal cues   Comprehension Verbalized understanding;Returned demonstration          PT Short Term Goals - 05/01/17 0856      PT SHORT TERM GOAL #1   Title Independent with an initial HEP.   Time 2   Period Weeks   Status On-going     PT SHORT TERM GOAL #2   Title Full active left knee extension.   Period Weeks   Status On-going     PT SHORT TERM GOAL #3   Title Active left knee flexion= 90 dgerees.   Time 2   Period Weeks   Status On-going           PT Long Term Goals - 05/01/17 0856      PT LONG TERM GOAL #1   Title Independent with an advanced HEP.   Time 8   Period Weeks   Status On-going     PT LONG TERM GOAL #2   Title Active left knee flexion to 115 degrees+ so the patient can perform functional tasks and do so with pain not > 2-3/10.   Time 8   Period Weeks   Status On-going     PT LONG TERM GOAL #3   Title Perform a reciprocating stair gait with one railing with pain not > 2-3/10.   Time 8   Period Weeks   Status On-going     PT LONG TERM GOAL #4   Title Perform ADL's with pain not > 2/10.   Period Weeks   Status On-going     PT LONG TERM GOAL #5   Title Normal left patellar mobility.   Time 8   Period Weeks   Status On-going     PT LONG TERM GOAL #6   Title Normalize gait pattern.   Time 8   Period Weeks   Status On-going               Plan - 05/03/17 1043    Clinical Impression Statement Pt tolerated treatment well with extensive PROM to left knee. Pt encouraged to perform prone hamstring pulls and prone knee hangs at home. Pt unable to tolerate prone in clinic on hard mat surface. Pt with improvements in PROM from 10-70 degrees this visit. Pt with 1 centimeter increase in swelling  around left knee. Continue with skilled PT to progress pt toward her goals set and improvements in RoM, strength and  function.    PT Frequency 3x / week   PT Duration 8 weeks   PT Treatment/Interventions ADLs/Self Care Home Management;Cryotherapy;Electrical Stimulation;Gait training;Stair training;Neuromuscular re-education;Therapeutic exercise;Therapeutic activities;Functional mobility training;Patient/family education;Manual techniques;Passive range of motion;Vasopneumatic Device   PT Next Visit Plan TKA protocol.  Left patellar mobs.   Electrical stimulation and vasopneumatic.   PT Home Exercise Plan Squats, lunges, prone knee hangs, prone hamstring curls with towel to assist with knee flexion.    Consulted and Agree with Plan of Care Patient      Patient will benefit from skilled therapeutic intervention in order to improve the following deficits and impairments:  Abnormal gait, Decreased activity tolerance, Decreased mobility, Decreased range of motion, Decreased strength, Increased edema, Pain  Visit Diagnosis: Chronic pain of left knee  Localized edema  Stiffness of left knee, not elsewhere classified     Problem List Patient Active Problem List   Diagnosis Date Noted  . Unilateral primary osteoarthritis, left knee 04/04/2017  . Status post total knee replacement, left 04/04/2017  . Acquired hypothyroidism 07/24/2014  . Carotid artery stenosis 09/12/2012  . Pounding in head 04/23/2012  . Hypersomnolence 04/23/2012  . Syncope and collapse 12/27/2011  . Essential hypertension 12/27/2011    Sharmon Leyden, MPT 05/03/2017, 10:55 AM  [facility] [address] [place], [place], [address] Phone: [phone]   Fax:  [phone]  Name: Claudia Rocha MRN: [id number] Date of Birth: [birth year]

## 2017-05-05 ENCOUNTER — Ambulatory Visit: Payer: Medicare HMO | Admitting: Physical Therapy

## 2017-05-05 DIAGNOSIS — G8929 Other chronic pain: Secondary | ICD-10-CM

## 2017-05-05 DIAGNOSIS — M25562 Pain in left knee: Secondary | ICD-10-CM

## 2017-05-05 DIAGNOSIS — R600 Localized edema: Secondary | ICD-10-CM

## 2017-05-05 DIAGNOSIS — M25662 Stiffness of left knee, not elsewhere classified: Secondary | ICD-10-CM

## 2017-05-05 NOTE — Patient Instructions (Signed)
Chair Knee Flexion   Keeping feet on floor, slide foot of operated leg back, bending knee. Hold __60__ seconds. Repeat __5__ times. Do __as many as possible_ a day.   Knee Wall Slide   Slowly "walk" or slide feet on wall toward floor until stretch is felt in knees. Hold as long as you can tolerate. Repeat _5___ times per set. Do ____ sets per session. Do _2-3___ sessions per day.  Solon Palm, PT 05/05/17 9:09 AM [facility] [address] [place], [place], [address] Phone: [phone]   Fax:  [phone]

## 2017-05-05 NOTE — Therapy (Signed)
[facility] [address] [place], [place], [address] Phone: [phone]   Fax:  [phone]  Physical Therapy Treatment  Patient Details  Name: Claudia Rocha MRN: [id number] Date of Birth: 15-Oct-1940 Referring Provider: Doneen Poisson MD.  Encounter Date: 05/05/2017      PT End of Session - 05/05/17 7253    Visit Number 6   Number of Visits 24   Date for PT Re-Evaluation 06/24/17   Authorization Type G-CODE EVERY 10TH VISIT AND KX MODIFIER BEGINNING ON 15TH VISIT.   PT Start Time 0815   PT Stop Time 0915   PT Time Calculation (min) 60 min   Activity Tolerance Patient tolerated treatment well   Behavior During Therapy WFL for tasks assessed/performed      Past Medical History:  Diagnosis Date  . Arthritis    spine  . Diabetes mellitus without complication (HCC)    type II  . Heart murmur    patient denies  . Hypertension    new onset   . Hypothyroidism   . Pulmonary embolism (HCC)    2016, was on eliquis immediately after, no longer on this  . Syncope     Past Surgical History:  Procedure Laterality Date  . ABDOMINAL HYSTERECTOMY    . CATARACT EXTRACTION    . TONSILLECTOMY    . TOTAL KNEE ARTHROPLASTY Left 04/04/2017   Procedure: LEFT TOTAL KNEE ARTHROPLASTY;  Surgeon: Kathryne Hitch, MD;  Location: [facility] OR;  Service: Orthopedics;  Laterality: Left;  . TUBAL LIGATION      There were no vitals filed for this visit.      Subjective Assessment - 05/05/17 0829    Subjective Patient states she bought a floor bike and was able to go around on it 10 times. Patient concerned about limited ROM.   Patient Stated Goals Get out of pain and back to normal.   Currently in Pain? Yes   Pain Score 3    Pain Location Knee   Pain Orientation Left   Pain Descriptors / Indicators Sore   Pain Type Surgical pain            [facility] PT Assessment - 05/05/17 0001      AROM   AROM Assessment Site Knee   Right/Left Knee Left   Left Knee Flexion 66     PROM   PROM Assessment Site Knee   Right/Left Knee Left   Left Knee Flexion 69                     [facility] Adult PT Treatment/Exercise - 05/05/17 0001      Knee/Hip Exercises: Aerobic   Nustep L3 x 15 min for ROM with VCs to avoid lifting hip      Knee/Hip Exercises: Standing   Forward Lunges Left;3 sets;10 reps     Knee/Hip Exercises: Supine   Knee Flexion AAROM;Left  on wall using RLE for assist; 2 x max hold     Modalities   Modalities Electrical Stimulation;Vasopneumatic     Electrical Stimulation   Electrical Stimulation Location L knee   Electrical Stimulation Action IFC   Electrical Stimulation Parameters 1-10 Hz x 15 min   Electrical Stimulation Goals Edema;Pain     Vasopneumatic   Number Minutes Vasopneumatic  15 minutes   Vasopnuematic Location  Knee   Vasopneumatic Pressure Medium   Vasopneumatic Temperature  34     Manual Therapy   Manual Therapy Joint mobilization;Soft tissue mobilization;Passive  ROM   Joint Mobilization L patellar mobs   Soft tissue mobilization to R quads   Passive ROM to L knee in supine and sitting into flexion                PT Education - 05/05/17 1135    Education provided Yes   Education Details HEP - supine wall slide   Person(s) Educated Patient   Methods Explanation;Demonstration;Verbal cues;Handout;Tactile cues   Comprehension Verbalized understanding;Returned demonstration          PT Short Term Goals - 05/01/17 0856      PT SHORT TERM GOAL #1   Title Independent with an initial HEP.   Time 2   Period Weeks   Status On-going     PT SHORT TERM GOAL #2   Title Full active left knee extension.   Period Weeks   Status On-going     PT SHORT TERM GOAL #3   Title Active left knee flexion= 90 dgerees.   Time 2   Period Weeks   Status On-going           PT Long Term Goals - 05/01/17 0856      PT LONG TERM GOAL #1   Title Independent  with an advanced HEP.   Time 8   Period Weeks   Status On-going     PT LONG TERM GOAL #2   Title Active left knee flexion to 115 degrees+ so the patient can perform functional tasks and do so with pain not > 2-3/10.   Time 8   Period Weeks   Status On-going     PT LONG TERM GOAL #3   Title Perform a reciprocating stair gait with one railing with pain not > 2-3/10.   Time 8   Period Weeks   Status On-going     PT LONG TERM GOAL #4   Title Perform ADL's with pain not > 2/10.   Period Weeks   Status On-going     PT LONG TERM GOAL #5   Title Normal left patellar mobility.   Time 8   Period Weeks   Status On-going     PT LONG TERM GOAL #6   Title Normalize gait pattern.   Time 8   Period Weeks   Status On-going               Plan - 05/05/17 1136    Clinical Impression Statement Patient did well with treatment today. She needed VCs to keep left hip on seat with Nustep. She did well with supine wall slide and will do at home. No gains in ROM. Patient may contact MD to let him know current status.   PT Treatment/Interventions ADLs/Self Care Home Management;Cryotherapy;Electrical Stimulation;Gait training;Stair training;Neuromuscular re-education;Therapeutic exercise;Therapeutic activities;Functional mobility training;Patient/family education;Manual techniques;Passive range of motion;Vasopneumatic Device   PT Next Visit Plan TKA protocol.  Left patellar mobs.   Electrical stimulation and vasopneumatic.      Patient will benefit from skilled therapeutic intervention in order to improve the following deficits and impairments:  Abnormal gait, Decreased activity tolerance, Decreased mobility, Decreased range of motion, Decreased strength, Increased edema, Pain  Visit Diagnosis: Stiffness of left knee, not elsewhere classified  Localized edema  Chronic pain of left knee     Problem List Patient Active Problem List   Diagnosis Date Noted  . Unilateral primary  osteoarthritis, left knee 04/04/2017  . Status post total knee replacement, left 04/04/2017  . Acquired hypothyroidism 07/24/2014  . Carotid  artery stenosis 09/12/2012  . Pounding in head 04/23/2012  . Hypersomnolence 04/23/2012  . Syncope and collapse 12/27/2011  . Essential hypertension 12/27/2011    Solon Palm PT 05/05/2017, 11:40 AM  [facility] [address] [place], [place], [address] Phone: [phone]   Fax:  [phone]  Name: Claudia Rocha MRN: [id number] Date of Birth: [birth year]

## 2017-05-08 ENCOUNTER — Encounter: Payer: Self-pay | Admitting: Physical Therapy

## 2017-05-08 ENCOUNTER — Ambulatory Visit (INDEPENDENT_AMBULATORY_CARE_PROVIDER_SITE_OTHER): Payer: Medicare HMO | Admitting: Orthopaedic Surgery

## 2017-05-08 ENCOUNTER — Ambulatory Visit: Payer: Medicare HMO | Admitting: Physical Therapy

## 2017-05-08 DIAGNOSIS — M25662 Stiffness of left knee, not elsewhere classified: Secondary | ICD-10-CM

## 2017-05-08 DIAGNOSIS — Z96652 Presence of left artificial knee joint: Secondary | ICD-10-CM

## 2017-05-08 DIAGNOSIS — R600 Localized edema: Secondary | ICD-10-CM

## 2017-05-08 DIAGNOSIS — M25562 Pain in left knee: Secondary | ICD-10-CM | POA: Diagnosis not present

## 2017-05-08 DIAGNOSIS — G8929 Other chronic pain: Secondary | ICD-10-CM

## 2017-05-08 NOTE — Therapy (Addendum)
[facility] [address] [place], [place], [address] Phone: [phone]   Fax:  [phone]  Physical Therapy Treatment  Patient Details  Name: Claudia Rocha MRN: [id number] Date of Birth: 01/14/1941 Referring Provider: Doneen Poisson MD.  Encounter Date: 05/08/2017      PT End of Session - 05/08/17 0735    Visit Number 7   Number of Visits 24   Date for PT Re-Evaluation 06/24/17   Authorization Type G-CODE EVERY 10TH VISIT AND KX MODIFIER BEGINNING ON 15TH VISIT.   PT Start Time [phone]   PT Stop Time 0823   PT Time Calculation (min) 52 min   Activity Tolerance Patient tolerated treatment well   Behavior During Therapy [facility] for tasks assessed/performed      Past Medical History:  Diagnosis Date  . Arthritis    spine  . Diabetes mellitus without complication (HCC)    type II  . Heart murmur    patient denies  . Hypertension    new onset   . Hypothyroidism   . Pulmonary embolism (HCC)    2016, was on eliquis immediately after, no longer on this  . Syncope     Past Surgical History:  Procedure Laterality Date  . ABDOMINAL HYSTERECTOMY    . CATARACT EXTRACTION    . TONSILLECTOMY    . TOTAL KNEE ARTHROPLASTY Left 04/04/2017   Procedure: LEFT TOTAL KNEE ARTHROPLASTY;  Surgeon: Kathryne Hitch, MD;  Location: [facility] OR;  Service: Orthopedics;  Laterality: Left;  . TUBAL LIGATION      There were no vitals filed for this visit.      Subjective Assessment - 05/08/17 0734    Subjective Reports that she has no pain in L knee and only if she tries to move it. No pain with ambulation. Reports that she goes to MD this morning.   Pertinent History DM.   Patient Stated Goals Get out of pain and back to normal.   Currently in Pain? No/denies            [facility] PT Assessment - 05/08/17 0001      Assessment   Medical Diagnosis Left total knee replacement.   Onset Date/Surgical Date 04/04/17   Next MD Visit  05/08/2017     Restrictions   Weight Bearing Restrictions No     Observation/Other Assessments-Edema    Edema Circumferential     Circumferential Edema   Circumferential - Right 46.8 cm   Circumferential - Left  49.2 cm     ROM / Strength   AROM / PROM / Strength AROM;PROM     AROM   Overall AROM  Deficits   AROM Assessment Site Knee   Right/Left Knee Left   Left Knee Extension -4   Left Knee Flexion 69     PROM   Overall PROM  Deficits   PROM Assessment Site Knee   Right/Left Knee Left   Left Knee Extension -2   Left Knee Flexion 77                     [facility] Adult PT Treatment/Exercise - 05/08/17 0001      Knee/Hip Exercises: Stretches   Passive Hamstring Stretch Left;3 reps;30 seconds   Quad Stretch Left;3 reps;30 seconds  in R SL   Other Knee/Hip Stretches IT band stretching x 30 holding 30 seconds each on left LE     Knee/Hip Exercises: Aerobic   Stationary Bike --   Nustep  L4, seat 6, x10 min     Knee/Hip Exercises: Standing   Forward Lunges Left;2 sets;10 reps;3 seconds   Forward Lunges Limitations 6" step     Modalities   Modalities Proofreader Location L knee   Electrical Stimulation Action IFC   Electrical Stimulation Parameters 1-10 hz x15 min   Electrical Stimulation Goals Edema;Pain     Vasopneumatic   Number Minutes Vasopneumatic  15 minutes   Vasopnuematic Location  Knee   Vasopneumatic Pressure Medium   Vasopneumatic Temperature  34     Manual Therapy   Manual Therapy Passive ROM   Soft tissue mobilization STW to L Quad/ ITB to reduce tightness   Passive ROM PROM of L knee into flexion and extension with holds at end range                  PT Short Term Goals - 05/01/17 0856      PT SHORT TERM GOAL #1   Title Independent with an initial HEP.   Time 2   Period Weeks   Status On-going     PT SHORT TERM GOAL #2   Title Full active  left knee extension.   Period Weeks   Status On-going     PT SHORT TERM GOAL #3   Title Active left knee flexion= 90 dgerees.   Time 2   Period Weeks   Status On-going           PT Long Term Goals - 05/01/17 0856      PT LONG TERM GOAL #1   Title Independent with an advanced HEP.   Time 8   Period Weeks   Status On-going     PT LONG TERM GOAL #2   Title Active left knee flexion to 115 degrees+ so the patient can perform functional tasks and do so with pain not > 2-3/10.   Time 8   Period Weeks   Status On-going     PT LONG TERM GOAL #3   Title Perform a reciprocating stair gait with one railing with pain not > 2-3/10.   Time 8   Period Weeks   Status On-going     PT LONG TERM GOAL #4   Title Perform ADL's with pain not > 2/10.   Period Weeks   Status On-going     PT LONG TERM GOAL #5   Title Normal left patellar mobility.   Time 8   Period Weeks   Status On-going     PT LONG TERM GOAL #6   Title Normalize gait pattern.   Time 8   Period Weeks   Status On-going               Plan - 05/08/17 0810    Clinical Impression Statement Patient presented in clinic with no current L knee pain and no pain during ambulation. Patient still very limited with ROM both actively and passively. No limitation with stretches in HS or ITB stretch but patient very limited with R SL L Quad stretch. Patient tender to palpation of the L Quad laterally and ITB distally. AROM L knee 4-69 deg, PROM 2-77 deg. 2.4 cm difference in B knee edema when measured circumferentially at midpatella location. Limited L patella mobility noted as well today. Normal modalities response noted following removal of the modalities.   Rehab Potential Good   PT Frequency 3x / week   PT Duration 8  weeks   PT Treatment/Interventions ADLs/Self Care Home Management;Cryotherapy;Electrical Stimulation;Gait training;Stair training;Neuromuscular re-education;Therapeutic exercise;Therapeutic  activities;Functional mobility training;Patient/family education;Manual techniques;Passive range of motion;Vasopneumatic Device   PT Next Visit Plan TKA protocol.  Left patellar mobs.   Electrical stimulation and vasopneumatic.   PT Home Exercise Plan Squats, lunges, prone knee hangs, prone hamstring curls with towel to assist with knee flexion.    Consulted and Agree with Plan of Care Patient      Patient will benefit from skilled therapeutic intervention in order to improve the following deficits and impairments:  Abnormal gait, Decreased activity tolerance, Decreased mobility, Decreased range of motion, Decreased strength, Increased edema, Pain  Visit Diagnosis: Stiffness of left knee, not elsewhere classified  Localized edema  Chronic pain of left knee     Problem List Patient Active Problem List   Diagnosis Date Noted  . Unilateral primary osteoarthritis, left knee 04/04/2017  . Status post total knee replacement, left 04/04/2017  . Acquired hypothyroidism 07/24/2014  . Carotid artery stenosis 09/12/2012  . Pounding in head 04/23/2012  . Hypersomnolence 04/23/2012  . Syncope and collapse 12/27/2011  . Essential hypertension 12/27/2011    Florence Canner, PTA 05/08/17 9:32 AM [place] MPT [facility] [address] [place], [place], [address] Phone: [phone]   Fax:  [phone]  Name: Claudia Rocha MRN: [id number] Date of Birth: [birth year]

## 2017-05-08 NOTE — Progress Notes (Signed)
The patient is now just over a month status post a left total knee arthroplasty. She is going to outpatient therapy at [facility] in [facility].  On exam her extension is almost full there is moderate swelling of the expected at 4 weeks postoperative. Her flexion is only to about 70 on my exam. Of note though her flexion on her nonoperative right knee only goes to about 95. The knee feels ligamentously stable. I have reviewed all the notes from therapy as well.  She has appointment with me in 2 weeks she'll keep that appointment. If her motion is got getting close to 90 we will consider manipulation under anesthesia with the idea of still we will likely get her to about 95 like her native right knee.

## 2017-05-09 ENCOUNTER — Encounter: Payer: Self-pay | Admitting: Physical Therapy

## 2017-05-09 ENCOUNTER — Ambulatory Visit: Payer: Medicare HMO | Admitting: Physical Therapy

## 2017-05-09 DIAGNOSIS — M25662 Stiffness of left knee, not elsewhere classified: Secondary | ICD-10-CM

## 2017-05-09 DIAGNOSIS — R600 Localized edema: Secondary | ICD-10-CM

## 2017-05-09 DIAGNOSIS — M25562 Pain in left knee: Secondary | ICD-10-CM | POA: Diagnosis not present

## 2017-05-09 DIAGNOSIS — G8929 Other chronic pain: Secondary | ICD-10-CM

## 2017-05-09 NOTE — Therapy (Signed)
[facility] [address] [place], [place], [address] Phone: [phone]   Fax:  [phone]  Physical Therapy Treatment  Patient Details  Name: Claudia Rocha MRN: [id number] Date of Birth: January 29, 1941 Referring Provider: Doneen Poisson MD.  Encounter Date: 05/09/2017      PT End of Session - 05/09/17 0744    Visit Number 8   Number of Visits 24   Date for PT Re-Evaluation 06/24/17   Authorization Type G-CODE EVERY 10TH VISIT AND KX MODIFIER BEGINNING ON 15TH VISIT.   PT Start Time [phone]   PT Stop Time 0820   PT Time Calculation (min) 45 min   Activity Tolerance Patient tolerated treatment well   Behavior During Therapy [facility] for tasks assessed/performed      Past Medical History:  Diagnosis Date  . Arthritis    spine  . Diabetes mellitus without complication (HCC)    type II  . Heart murmur    patient denies  . Hypertension    new onset   . Hypothyroidism   . Pulmonary embolism (HCC)    2016, was on eliquis immediately after, no longer on this  . Syncope     Past Surgical History:  Procedure Laterality Date  . ABDOMINAL HYSTERECTOMY    . CATARACT EXTRACTION    . TONSILLECTOMY    . TOTAL KNEE ARTHROPLASTY Left 04/04/2017   Procedure: LEFT TOTAL KNEE ARTHROPLASTY;  Surgeon: Kathryne Hitch, MD;  Location: [facility] OR;  Service: Orthopedics;  Laterality: Left;  . TUBAL LIGATION      There were no vitals filed for this visit.      Subjective Assessment - 05/09/17 0743    Subjective Reports that the MD did not aspirate her L knee and said that it is not depending properly and to really work ROM.   Pertinent History DM.   Patient Stated Goals Get out of pain and back to normal.   Currently in Pain? Yes   Pain Score 3    Pain Location Knee   Pain Orientation Left   Pain Descriptors / Indicators Discomfort   Pain Type Surgical pain   Pain Onset More than a month ago   Pain Frequency Intermittent   Aggravating Factors  L knee ROM            [facility] PT Assessment - 05/09/17 0001      Assessment   Medical Diagnosis Left total knee replacement.   Onset Date/Surgical Date 04/04/17   Next MD Visit 05/22/2017     Restrictions   Weight Bearing Restrictions No     ROM / Strength   AROM / PROM / Strength AROM     AROM   Overall AROM  Deficits   AROM Assessment Site Knee   Right/Left Knee Left   Left Knee Extension -5   Left Knee Flexion 71                     [facility] Adult PT Treatment/Exercise - 05/09/17 0001      Knee/Hip Exercises: Aerobic   Nustep L4, seat 7, x10 min     Knee/Hip Exercises: Machines for Strengthening   Cybex Leg Press 1 pl, seat 6, x20 reps     Knee/Hip Exercises: Standing   Forward Lunges Left;2 sets;10 reps;3 seconds   Forward Lunges Limitations 6" step   Functional Squat 3 sets;10 reps     Modalities   Modalities Electrical Stimulation;Vasopneumatic     Electrical Stimulation  Electrical Stimulation Location L knee   Electrical Stimulation Action IFC   Electrical Stimulation Parameters 1-10 hz x15 min   Electrical Stimulation Goals Edema;Pain     Vasopneumatic   Number Minutes Vasopneumatic  15 minutes   Vasopnuematic Location  Knee   Vasopneumatic Pressure Medium   Vasopneumatic Temperature  34     Manual Therapy   Manual Therapy Passive ROM;Soft tissue mobilization   Soft tissue mobilization L patellar mobilizations in med/lat directions to promote proper mobility   Passive ROM PROM of L knee into flexion with holds at end r ange                  PT Short Term Goals - 05/01/17 0856      PT SHORT TERM GOAL #1   Title Independent with an initial HEP.   Time 2   Period Weeks   Status On-going     PT SHORT TERM GOAL #2   Title Full active left knee extension.   Period Weeks   Status On-going     PT SHORT TERM GOAL #3   Title Active left knee flexion= 90 dgerees.   Time 2   Period Weeks   Status  On-going           PT Long Term Goals - 05/01/17 0856      PT LONG TERM GOAL #1   Title Independent with an advanced HEP.   Time 8   Period Weeks   Status On-going     PT LONG TERM GOAL #2   Title Active left knee flexion to 115 degrees+ so the patient can perform functional tasks and do so with pain not > 2-3/10.   Time 8   Period Weeks   Status On-going     PT LONG TERM GOAL #3   Title Perform a reciprocating stair gait with one railing with pain not > 2-3/10.   Time 8   Period Weeks   Status On-going     PT LONG TERM GOAL #4   Title Perform ADL's with pain not > 2/10.   Period Weeks   Status On-going     PT LONG TERM GOAL #5   Title Normal left patellar mobility.   Time 8   Period Weeks   Status On-going     PT LONG TERM GOAL #6   Title Normalize gait pattern.   Time 8   Period Weeks   Status On-going               Plan - 05/09/17 9528    Clinical Impression Statement Patient continues to present in clinic with limited L knee ROM especially flexion. Patient reports being discouraged in regards to her minimal improvements but patient encouraged to continue working hard on her exercises and not to give up. Patient guided through various exercises that focused on ROM and not strengthening. Discomfort still noted at end range L knee flexion during exercises. Limited L patellar mobility palpated today in med/lat directions. AROM L knee measured as 5-71 deg today. All steristrips were removed by Dr. Magnus Ivan yesterday per patient report. Normal modalities response noted following removal of  the modalities.   Rehab Potential Good   PT Frequency 3x / week   PT Duration 8 weeks   PT Treatment/Interventions ADLs/Self Care Home Management;Cryotherapy;Electrical Stimulation;Gait training;Stair training;Neuromuscular re-education;Therapeutic exercise;Therapeutic activities;Functional mobility training;Patient/family education;Manual techniques;Passive range of  motion;Vasopneumatic Device   PT Next Visit Plan TKA protocol.  Left patellar mobs.  Electrical stimulation and vasopneumatic.   PT Home Exercise Plan Squats, lunges, prone knee hangs, prone hamstring curls with towel to assist with knee flexion.    Consulted and Agree with Plan of Care Patient      Patient will benefit from skilled therapeutic intervention in order to improve the following deficits and impairments:  Abnormal gait, Decreased activity tolerance, Decreased mobility, Decreased range of motion, Decreased strength, Increased edema, Pain  Visit Diagnosis: Stiffness of left knee, not elsewhere classified  Localized edema  Chronic pain of left knee     Problem List Patient Active Problem List   Diagnosis Date Noted  . Unilateral primary osteoarthritis, left knee 04/04/2017  . Status post total knee replacement, left 04/04/2017  . Acquired hypothyroidism 07/24/2014  . Carotid artery stenosis 09/12/2012  . Pounding in head 04/23/2012  . Hypersomnolence 04/23/2012  . Syncope and collapse 12/27/2011  . Essential hypertension 12/27/2011    Evelene Croon, PTA 05/09/2017, 8:46 AM  [facility] [address] [place], [place], [address] Phone: [phone]   Fax:  [phone]  Name: Claudia Rocha MRN: [id number] Date of Birth: 09/16/1940

## 2017-05-10 ENCOUNTER — Encounter: Payer: Self-pay | Admitting: Physical Therapy

## 2017-05-10 ENCOUNTER — Ambulatory Visit: Payer: Medicare HMO | Admitting: Physical Therapy

## 2017-05-10 DIAGNOSIS — M25562 Pain in left knee: Secondary | ICD-10-CM | POA: Diagnosis not present

## 2017-05-10 DIAGNOSIS — R600 Localized edema: Secondary | ICD-10-CM

## 2017-05-10 DIAGNOSIS — M25662 Stiffness of left knee, not elsewhere classified: Secondary | ICD-10-CM

## 2017-05-10 DIAGNOSIS — G8929 Other chronic pain: Secondary | ICD-10-CM

## 2017-05-10 NOTE — Therapy (Signed)
[facility] [address] [place], [place], [address] Phone: [phone]   Fax:  [phone]  Physical Therapy Treatment  Patient Details  Name: Claudia Rocha MRN: [id number] Date of Birth: [birth year] Referring Provider: Doneen Poisson MD.  Encounter Date: 05/10/2017      PT End of Session - 05/10/17 0736    Visit Number 9   Number of Visits 24   Date for PT Re-Evaluation 06/24/17   Authorization Type G-CODE EVERY 10TH VISIT AND KX MODIFIER BEGINNING ON 15TH VISIT.   PT Start Time [phone]   PT Stop Time 0823   PT Time Calculation (min) 50 min   Activity Tolerance Patient tolerated treatment well   Behavior During Therapy [facility] for tasks assessed/performed      Past Medical History:  Diagnosis Date  . Arthritis    spine  . Diabetes mellitus without complication (HCC)    type II  . Heart murmur    patient denies  . Hypertension    new onset   . Hypothyroidism   . Pulmonary embolism (HCC)    2016, was on eliquis immediately after, no longer on this  . Syncope     Past Surgical History:  Procedure Laterality Date  . ABDOMINAL HYSTERECTOMY    . CATARACT EXTRACTION    . TONSILLECTOMY    . TOTAL KNEE ARTHROPLASTY Left 04/04/2017   Procedure: LEFT TOTAL KNEE ARTHROPLASTY;  Surgeon: Kathryne Hitch, MD;  Location: [facility] OR;  Service: Orthopedics;  Laterality: Left;  . TUBAL LIGATION      There were no vitals filed for this visit.      Subjective Assessment - 05/10/17 0736    Subjective Continued L knee stiffness.   Pertinent History DM.   Patient Stated Goals Get out of pain and back to normal.   Currently in Pain? Yes   Pain Score 2    Pain Location Knee   Pain Orientation Left   Pain Descriptors / Indicators Discomfort   Pain Type Surgical pain   Pain Onset More than a month ago            [facility] PT Assessment - 05/10/17 0001      Assessment   Medical Diagnosis Left total knee replacement.   Onset  Date/Surgical Date 04/04/17   Next MD Visit 05/22/2017     Restrictions   Weight Bearing Restrictions No     Observation/Other Assessments-Edema    Edema Circumferential     Circumferential Edema   Circumferential - Right 47.5 cm   Circumferential - Left  49 cm                     [facility] Adult PT Treatment/Exercise - 05/10/17 0001      Knee/Hip Exercises: Aerobic   Nustep L3, seat 8-5, x12 min     Knee/Hip Exercises: Machines for Strengthening   Cybex Leg Press 1 pl, seat 6, x20 reps     Knee/Hip Exercises: Standing   Forward Lunges Left;2 sets;10 reps;3 seconds   Forward Lunges Limitations 6" step     Modalities   Modalities Proofreader Location L knee   Electrical Stimulation Action IFC   Electrical Stimulation Parameters 1-10 hz x15 min   Electrical Stimulation Goals Edema;Pain     Vasopneumatic   Number Minutes Vasopneumatic  15 minutes   Vasopnuematic Location  Knee   Vasopneumatic Pressure Medium   Vasopneumatic  Temperature  34     Manual Therapy   Manual Therapy Passive ROM   Passive ROM PROM of L knee into flexion with holds at end r ange                  PT Short Term Goals - 05/01/17 0856      PT SHORT TERM GOAL #1   Title Independent with an initial HEP.   Time 2   Period Weeks   Status On-going     PT SHORT TERM GOAL #2   Title Full active left knee extension.   Period Weeks   Status On-going     PT SHORT TERM GOAL #3   Title Active left knee flexion= 90 dgerees.   Time 2   Period Weeks   Status On-going           PT Long Term Goals - 05/01/17 0856      PT LONG TERM GOAL #1   Title Independent with an advanced HEP.   Time 8   Period Weeks   Status On-going     PT LONG TERM GOAL #2   Title Active left knee flexion to 115 degrees+ so the patient can perform functional tasks and do so with pain not > 2-3/10.   Time 8   Period  Weeks   Status On-going     PT LONG TERM GOAL #3   Title Perform a reciprocating stair gait with one railing with pain not > 2-3/10.   Time 8   Period Weeks   Status On-going     PT LONG TERM GOAL #4   Title Perform ADL's with pain not > 2/10.   Period Weeks   Status On-going     PT LONG TERM GOAL #5   Title Normal left patellar mobility.   Time 8   Period Weeks   Status On-going     PT LONG TERM GOAL #6   Title Normalize gait pattern.   Time 8   Period Weeks   Status On-going               Plan - 05/10/17 0809    Clinical Impression Statement Patient continues to demonstrate minimal improvements in L knee flexion. NuStep seat adjusted periodically to increase knee flexion. Patient still very limited with ROM actively and passively with knee flexion. AROM of R knee measured as 75 deg following prolonged holds  at end range during PROM. Normal modalities response noted following removal of the modalities.   Rehab Potential Good   PT Frequency 3x / week   PT Duration 8 weeks   PT Treatment/Interventions ADLs/Self Care Home Management;Cryotherapy;Electrical Stimulation;Gait training;Stair training;Neuromuscular re-education;Therapeutic exercise;Therapeutic activities;Functional mobility training;Patient/family education;Manual techniques;Passive range of motion;Vasopneumatic Device   PT Next Visit Plan TKA protocol.  Left patellar mobs.   Electrical stimulation and vasopneumatic.   PT Home Exercise Plan Squats, lunges, prone knee hangs, prone hamstring curls with towel to assist with knee flexion.    Consulted and Agree with Plan of Care Patient      Patient will benefit from skilled therapeutic intervention in order to improve the following deficits and impairments:  Abnormal gait, Decreased activity tolerance, Decreased mobility, Decreased range of motion, Decreased strength, Increased edema, Pain  Visit Diagnosis: Stiffness of left knee, not elsewhere  classified  Localized edema  Chronic pain of left knee     Problem List Patient Active Problem List   Diagnosis Date Noted  . Unilateral primary  osteoarthritis, left knee 04/04/2017  . Status post total knee replacement, left 04/04/2017  . Acquired hypothyroidism 07/24/2014  . Carotid artery stenosis 09/12/2012  . Pounding in head 04/23/2012  . Hypersomnolence 04/23/2012  . Syncope and collapse 12/27/2011  . Essential hypertension 12/27/2011    Evelene Croon, PTA 05/10/2017, 8:25 AM  [facility] [address] [place], [place], [address] Phone: [phone]   Fax:  [phone]  Name: AZALIYAH KENNARD MRN: [id number] Date of Birth: September 30, 1940

## 2017-05-15 ENCOUNTER — Ambulatory Visit: Payer: Medicare HMO | Attending: Orthopaedic Surgery | Admitting: Physical Therapy

## 2017-05-15 DIAGNOSIS — M25662 Stiffness of left knee, not elsewhere classified: Secondary | ICD-10-CM

## 2017-05-15 DIAGNOSIS — M25562 Pain in left knee: Secondary | ICD-10-CM | POA: Diagnosis present

## 2017-05-15 DIAGNOSIS — R600 Localized edema: Secondary | ICD-10-CM | POA: Diagnosis present

## 2017-05-15 DIAGNOSIS — G8929 Other chronic pain: Secondary | ICD-10-CM | POA: Diagnosis present

## 2017-05-15 NOTE — Therapy (Signed)
[facility] [address] [place], [place], [address] Phone: [phone]   Fax:  [phone]  Physical Therapy Treatment  Patient Details  Name: Claudia Rocha MRN: [id number] Date of Birth: [birth year] Referring Provider: Doneen Poisson MD.  Encounter Date: 05/15/2017      PT End of Session - 05/15/17 1600    Visit Number 10   Number of Visits 24   Date for PT Re-Evaluation 06/24/17   Authorization Type G-CODE EVERY 10TH VISIT AND KX MODIFIER BEGINNING ON 15TH VISIT.   PT Start Time (P)  0315      Past Medical History:  Diagnosis Date  . Arthritis    spine  . Diabetes mellitus without complication (HCC)    type II  . Heart murmur    patient denies  . Hypertension    new onset   . Hypothyroidism   . Pulmonary embolism (HCC)    2016, was on eliquis immediately after, no longer on this  . Syncope     Past Surgical History:  Procedure Laterality Date  . ABDOMINAL HYSTERECTOMY    . CATARACT EXTRACTION    . TONSILLECTOMY    . TOTAL KNEE ARTHROPLASTY Left 04/04/2017   Procedure: LEFT TOTAL KNEE ARTHROPLASTY;  Surgeon: Kathryne Hitch, MD;  Location: [facility] OR;  Service: Orthopedics;  Laterality: Left;  . TUBAL LIGATION      There were no vitals filed for this visit.      Subjective Assessment - 05/15/17 1557    Subjective Knee feels stiff and leg muscles tight.   Pain Score 2    Pain Location Knee   Pain Orientation Left   Pain Descriptors / Indicators Discomfort   Pain Type Surgical pain   Pain Onset More than a month ago                         [facility] Adult PT Treatment/Exercise - 05/15/17 0001      Exercises   Exercises Knee/Hip     Knee/Hip Exercises: Aerobic   Nustep Level 4 moving forward x 4 to increase flexion over 15 minutes to seat 4.     Modalities   Modalities Acupuncturist Stimulation Location Left knee.    Electrical Stimulation Action IFC   Electrical Stimulation Parameters 1-10 hz on constant x 15 minutes.   Electrical Stimulation Goals Edema;Pain     Vasopneumatic   Number Minutes Vasopneumatic  15 minutes   Vasopnuematic Location  --  Left knee.   Vasopneumatic Pressure Medium     Manual Therapy   Manual Therapy Passive ROM   Passive ROM PROM and left patellar mobs to patient tolerance x 12 minutes.                  PT Short Term Goals - 05/01/17 0856      PT SHORT TERM GOAL #1   Title Independent with an initial HEP.   Time 2   Period Weeks   Status On-going     PT SHORT TERM GOAL #2   Title Full active left knee extension.   Period Weeks   Status On-going     PT SHORT TERM GOAL #3   Title Active left knee flexion= 90 dgerees.   Time 2   Period Weeks   Status On-going           PT Long Term Goals - 05/01/17  1610      PT LONG TERM GOAL #1   Title Independent with an advanced HEP.   Time 8   Period Weeks   Status On-going     PT LONG TERM GOAL #2   Title Active left knee flexion to 115 degrees+ so the patient can perform functional tasks and do so with pain not > 2-3/10.   Time 8   Period Weeks   Status On-going     PT LONG TERM GOAL #3   Title Perform a reciprocating stair gait with one railing with pain not > 2-3/10.   Time 8   Period Weeks   Status On-going     PT LONG TERM GOAL #4   Title Perform ADL's with pain not > 2/10.   Period Weeks   Status On-going     PT LONG TERM GOAL #5   Title Normal left patellar mobility.   Time 8   Period Weeks   Status On-going     PT LONG TERM GOAL #6   Title Normalize gait pattern.   Time 8   Period Weeks   Status On-going               Plan - 2017-05-19 1632    Clinical Impression Statement Patient's knee is very stiff.  Able to achieve just 75 degrees of left knee flexion with PROM.      Patient will benefit from skilled therapeutic intervention in order to improve the  following deficits and impairments:  Abnormal gait, Decreased activity tolerance, Decreased mobility, Decreased range of motion, Decreased strength, Increased edema, Pain  Visit Diagnosis: Stiffness of left knee, not elsewhere classified  Localized edema  Chronic pain of left knee       G-Codes - 05/19/2017 1556    Functional Assessment Tool Used (Outpatient Only) FOTO....48% limitation....10th visit.   Functional Limitation Mobility: Walking and moving around   Mobility: Walking and Moving Around Current Status [phone]) At least 40 percent but less than 60 percent impaired, limited or restricted   Mobility: Walking and Moving Around Goal Status [phone]) At least 20 percent but less than 40 percent impaired, limited or restricted      Problem List Patient Active Problem List   Diagnosis Date Noted  . Unilateral primary osteoarthritis, left knee 04/04/2017  . Status post total knee replacement, left 04/04/2017  . Acquired hypothyroidism 07/24/2014  . Carotid artery stenosis 09/12/2012  . Pounding in head 04/23/2012  . Hypersomnolence 04/23/2012  . Syncope and collapse 12/27/2011  . Essential hypertension 12/27/2011    Cyerra Yim, [place] MPT 05/19/17, 4:36 PM  [facility] [address] [place], [place], [address] Phone: [phone]   Fax:  [phone]  Name: Claudia Rocha MRN: [id number] Date of Birth: [birth year]

## 2017-05-17 ENCOUNTER — Ambulatory Visit: Payer: Medicare HMO | Admitting: Physical Therapy

## 2017-05-17 ENCOUNTER — Encounter: Payer: Self-pay | Admitting: Physical Therapy

## 2017-05-17 DIAGNOSIS — M25662 Stiffness of left knee, not elsewhere classified: Secondary | ICD-10-CM

## 2017-05-17 DIAGNOSIS — R600 Localized edema: Secondary | ICD-10-CM

## 2017-05-17 DIAGNOSIS — G8929 Other chronic pain: Secondary | ICD-10-CM

## 2017-05-17 DIAGNOSIS — M25562 Pain in left knee: Secondary | ICD-10-CM

## 2017-05-17 NOTE — Therapy (Signed)
[facility] [address] [place], [place], [address] Phone: [phone]   Fax:  [phone]  Physical Therapy Treatment  Patient Details  Name: Claudia Rocha MRN: [id number] Date of Birth: 09/06/1940 Referring Provider: Doneen Poisson MD.  Encounter Date: 05/17/2017      PT End of Session - 05/17/17 0735    Visit Number 11   Number of Visits 24   Date for PT Re-Evaluation 06/24/17   Authorization Type G-CODE EVERY 10TH VISIT AND KX MODIFIER BEGINNING ON 15TH VISIT.   PT Start Time 0732   PT Stop Time 0824   PT Time Calculation (min) 52 min   Activity Tolerance Patient tolerated treatment well   Behavior During Therapy [facility] for tasks assessed/performed      Past Medical History:  Diagnosis Date  . Arthritis    spine  . Diabetes mellitus without complication (HCC)    type II  . Heart murmur    patient denies  . Hypertension    new onset   . Hypothyroidism   . Pulmonary embolism (HCC)    2016, was on eliquis immediately after, no longer on this  . Syncope     Past Surgical History:  Procedure Laterality Date  . ABDOMINAL HYSTERECTOMY    . CATARACT EXTRACTION    . TONSILLECTOMY    . TOTAL KNEE ARTHROPLASTY Left 04/04/2017   Procedure: LEFT TOTAL KNEE ARTHROPLASTY;  Surgeon: Kathryne Hitch, MD;  Location: [facility] OR;  Service: Orthopedics;  Laterality: Left;  . TUBAL LIGATION      There were no vitals filed for this visit.      Subjective Assessment - 05/17/17 0735    Subjective Reports she goes to MD 05/22/2017. Reports that she doesn't want the manipulation as she is scared her knee will tighten up again.   Pertinent History DM.   Patient Stated Goals Get out of pain and back to normal.   Currently in Pain? No/denies            [facility] PT Assessment - 05/17/17 0001      Assessment   Medical Diagnosis Left total knee replacement.   Onset Date/Surgical Date 04/04/17   Next MD Visit 05/22/2017     Restrictions   Weight Bearing Restrictions No     Observation/Other Assessments-Edema    Edema Circumferential     Circumferential Edema   Circumferential - Right 47 cm   Circumferential - Left  48.9 cm     ROM / Strength   AROM / PROM / Strength AROM     AROM   Overall AROM  Deficits   AROM Assessment Site Knee   Right/Left Knee Left   Left Knee Extension -8   Left Knee Flexion 76                     [facility] Adult PT Treatment/Exercise - 05/17/17 0001      Knee/Hip Exercises: Aerobic   Nustep L4, seat 5-4, x10 min     Knee/Hip Exercises: Standing   Forward Lunges Left;10 reps;5 seconds   Forward Lunges Limitations 6" step   Functional Squat 2 sets;10 reps;5 seconds     Modalities   Modalities Electrical Stimulation;Vasopneumatic     Electrical Stimulation   Electrical Stimulation Location L knee   Electrical Stimulation Action IFC   Electrical Stimulation Parameters 1-10 hz x15 min   Electrical Stimulation Goals Edema;Pain     Vasopneumatic   Number Minutes Vasopneumatic  15 minutes   Vasopnuematic Location  Knee   Vasopneumatic Pressure Medium   Vasopneumatic Temperature  36     Manual Therapy   Manual Therapy Passive ROM   Passive ROM PROM of L knee into flexion with holds at end range                  PT Short Term Goals - 05/01/17 0856      PT SHORT TERM GOAL #1   Title Independent with an initial HEP.   Time 2   Period Weeks   Status On-going     PT SHORT TERM GOAL #2   Title Full active left knee extension.   Period Weeks   Status On-going     PT SHORT TERM GOAL #3   Title Active left knee flexion= 90 dgerees.   Time 2   Period Weeks   Status On-going           PT Long Term Goals - 05/01/17 0856      PT LONG TERM GOAL #1   Title Independent with an advanced HEP.   Time 8   Period Weeks   Status On-going     PT LONG TERM GOAL #2   Title Active left knee flexion to 115 degrees+ so the patient can perform  functional tasks and do so with pain not > 2-3/10.   Time 8   Period Weeks   Status On-going     PT LONG TERM GOAL #3   Title Perform a reciprocating stair gait with one railing with pain not > 2-3/10.   Time 8   Period Weeks   Status On-going     PT LONG TERM GOAL #4   Title Perform ADL's with pain not > 2/10.   Period Weeks   Status On-going     PT LONG TERM GOAL #5   Title Normal left patellar mobility.   Time 8   Period Weeks   Status On-going     PT LONG TERM GOAL #6   Title Normalize gait pattern.   Time 8   Period Weeks   Status On-going               Plan - 05/17/17 0809    Clinical Impression Statement Patient remains very limited with L knee ROM especially into flexion at this time. Patient was to the point of tears in clinic with Nustep as well as squats and lunges today. PROM of the L knee flexion measured as 76 deg. AROM L knee extension measured as 8 deg from neutral. 1.9 cm difference in B knee edema with L > R. Normal modalities response noted following removal of the modalities.   Rehab Potential Good   PT Frequency 3x / week   PT Duration 8 weeks   PT Treatment/Interventions ADLs/Self Care Home Management;Cryotherapy;Electrical Stimulation;Gait training;Stair training;Neuromuscular re-education;Therapeutic exercise;Therapeutic activities;Functional mobility training;Patient/family education;Manual techniques;Passive range of motion;Vasopneumatic Device   PT Next Visit Plan TKA protocol.  Left patellar mobs.   Electrical stimulation and vasopneumatic.   PT Home Exercise Plan Squats, lunges, prone knee hangs, prone hamstring curls with towel to assist with knee flexion.    Consulted and Agree with Plan of Care Patient;Family member/caregiver   Family Member Consulted Husband      Patient will benefit from skilled therapeutic intervention in order to improve the following deficits and impairments:  Abnormal gait, Decreased activity tolerance, Decreased  mobility, Decreased range of motion, Decreased strength, Increased edema, Pain  Visit Diagnosis: Stiffness of left knee, not elsewhere classified  Localized edema  Chronic pain of left knee     Problem List Patient Active Problem List   Diagnosis Date Noted  . Unilateral primary osteoarthritis, left knee 04/04/2017  . Status post total knee replacement, left 04/04/2017  . Acquired hypothyroidism 07/24/2014  . Carotid artery stenosis 09/12/2012  . Pounding in head 04/23/2012  . Hypersomnolence 04/23/2012  . Syncope and collapse 12/27/2011  . Essential hypertension 12/27/2011    Evelene Croon, PTA 05/17/2017, 8:26 AM  [facility] [address] [place], [place], [address] Phone: [phone]   Fax:  [phone]  Name: Claudia Rocha MRN: [id number] Date of Birth: 07-Jan-1941

## 2017-05-19 ENCOUNTER — Ambulatory Visit: Payer: Medicare HMO | Admitting: Physical Therapy

## 2017-05-19 ENCOUNTER — Encounter: Payer: Self-pay | Admitting: Physical Therapy

## 2017-05-19 DIAGNOSIS — M25662 Stiffness of left knee, not elsewhere classified: Secondary | ICD-10-CM | POA: Diagnosis not present

## 2017-05-19 DIAGNOSIS — G8929 Other chronic pain: Secondary | ICD-10-CM

## 2017-05-19 DIAGNOSIS — M25562 Pain in left knee: Secondary | ICD-10-CM

## 2017-05-19 DIAGNOSIS — R600 Localized edema: Secondary | ICD-10-CM

## 2017-05-19 NOTE — Therapy (Addendum)
[facility] [place], [place], [address] Phone: [phone]   Fax:  [phone]  Physical Therapy Treatment/Discharge  Patient Details  Name: Claudia Rocha MRN: [id number] Date of Birth: 1941-05-20 Referring Provider: Jean Rosenthal MD.  Encounter Date: 05/19/2017      PT End of Session - 05/19/17 0737    Visit Number 12   Number of Visits 24   Date for PT Re-Evaluation 06/24/17   Authorization Type G-CODE EVERY 10TH VISIT AND KX MODIFIER BEGINNING ON 15TH VISIT.   PT Start Time 0732   PT Stop Time 0830   PT Time Calculation (min) 58 min   Activity Tolerance Patient tolerated treatment well   Behavior During Therapy [facility] for tasks assessed/performed      Past Medical History:  Diagnosis Date  . Arthritis    spine  . Diabetes mellitus without complication ([place])    type II  . Heart murmur    patient denies  . Hypertension    new onset   . Hypothyroidism   . Pulmonary embolism ([place])    2016, was on eliquis immediately after, no longer on this  . Syncope     Past Surgical History:  Procedure Laterality Date  . ABDOMINAL HYSTERECTOMY    . CATARACT EXTRACTION    . TONSILLECTOMY    . TOTAL KNEE ARTHROPLASTY Left 04/04/2017   Procedure: LEFT TOTAL KNEE ARTHROPLASTY;  Surgeon: Mcarthur Rossetti, MD;  Location: [facility];  Service: Orthopedics;  Laterality: Left;  . TUBAL LIGATION      There were no vitals filed for this visit.      Subjective Assessment - 05/19/17 0736    Subjective Reports her knee feels the same.    Pertinent History DM.   Patient Stated Goals Get out of pain and back to normal.   Currently in Pain? No/denies            [facility] PT Assessment - 05/19/17 0001      Assessment   Medical Diagnosis Left total knee replacement.   Onset Date/Surgical Date 04/04/17   Next MD Visit 05/22/2017     Restrictions   Weight Bearing Restrictions No     Circumferential Edema   Circumferential - Right 48 cm   Circumferential - Left  45.8 cm     ROM / Strength   AROM / PROM / Strength PROM     AROM   Overall AROM  Deficits   AROM Assessment Site Knee   Right/Left Knee Left;Right   Right Knee Extension 0   Right Knee Flexion 110   Left Knee Extension -2   Left Knee Flexion 78     PROM   Overall PROM  Deficits   PROM Assessment Site Knee   Right/Left Knee Left   Left Knee Flexion 80                     [facility] Adult PT Treatment/Exercise - 05/19/17 0001      Knee/Hip Exercises: Aerobic   Nustep L4, seat 5 x10 min     Knee/Hip Exercises: Standing   Forward Lunges Left;10 reps;5 seconds   Forward Lunges Limitations 8" step     Knee/Hip Exercises: Seated   Sit to Sand 15 reps  to chair for knee ROM     Modalities   Modalities Electrical Stimulation;Vasopneumatic     Acupuncturist Location L knee   Electrical Stimulation Action IFC  Electrical Stimulation Parameters 1-10 hz x15 min   Electrical Stimulation Goals Edema;Pain     Vasopneumatic   Number Minutes Vasopneumatic  15 minutes   Vasopnuematic Location  Knee   Vasopneumatic Pressure Medium   Vasopneumatic Temperature  36     Manual Therapy   Manual Therapy Passive ROM   Passive ROM PROM of L knee into flexion, ext with holds at end range                  PT Short Term Goals - 05/01/17 2683      PT SHORT TERM GOAL #1   Title Independent with an initial HEP.   Time 2   Period Weeks   Status On-going     PT SHORT TERM GOAL #2   Title Full active left knee extension.   Period Weeks   Status On-going     PT SHORT TERM GOAL #3   Title Active left knee flexion= 90 dgerees.   Time 2   Period Weeks   Status On-going           PT Long Term Goals - 05/01/17 0856      PT LONG TERM GOAL #1   Title Independent with an advanced HEP.   Time 8   Period Weeks   Status On-going     PT LONG TERM GOAL #2   Title Active  left knee flexion to 115 degrees+ so the patient can perform functional tasks and do so with pain not > 2-3/10.   Time 8   Period Weeks   Status On-going     PT LONG TERM GOAL #3   Title Perform a reciprocating stair gait with one railing with pain not > 2-3/10.   Time 8   Period Weeks   Status On-going     PT LONG TERM GOAL #4   Title Perform ADL's with pain not > 2/10.   Period Weeks   Status On-going     PT LONG TERM GOAL #5   Title Normal left patellar mobility.   Time 8   Period Weeks   Status On-going     PT LONG TERM GOAL #6   Title Normalize gait pattern.   Time 8   Period Weeks   Status On-going               Plan - 05/19/17 0818    Clinical Impression Statement Patient remains limited with L knee ROM at this time although slightly improved ROM noted today. Patient still to the point of tears with any ROM activities. Patient reports ROM activities completed at home twice a day. AROM of L knee measured as 2-78 deg today and passive flexion to 80 deg. Comparison measurement taken of the R knee with it measured as 0-110 deg. 2.2 cm difference with edema with L > R.   Rehab Potential Good   PT Frequency 3x / week   PT Duration 8 weeks   PT Treatment/Interventions ADLs/Self Care Home Management;Cryotherapy;Electrical Stimulation;Gait training;Stair training;Neuromuscular re-education;Therapeutic exercise;Therapeutic activities;Functional mobility training;Patient/family education;Manual techniques;Passive range of motion;Vasopneumatic Device   PT Next Visit Plan TKA protocol.  Left patellar mobs.   Electrical stimulation and vasopneumatic.   PT Home Exercise Plan Squats, lunges, prone knee hangs, prone hamstring curls with towel to assist with knee flexion.    Consulted and Agree with Plan of Care Patient;Family member/caregiver   Family Member Consulted Husband      Patient will benefit from skilled therapeutic intervention  in order to improve the following  deficits and impairments:  Abnormal gait, Decreased activity tolerance, Decreased mobility, Decreased range of motion, Decreased strength, Increased edema, Pain  Visit Diagnosis: Stiffness of left knee, not elsewhere classified  Localized edema  Chronic pain of left knee     Problem List Patient Active Problem List   Diagnosis Date Noted  . Unilateral primary osteoarthritis, left knee 04/04/2017  . Status post total knee replacement, left 04/04/2017  . Acquired hypothyroidism 07/24/2014  . Carotid artery stenosis 09/12/2012  . Pounding in head 04/23/2012  . Hypersomnolence 04/23/2012  . Syncope and collapse 12/27/2011  . Essential hypertension 12/27/2011   PHYSICAL THERAPY DISCHARGE SUMMARY  Visits from Start of Care: 12  Current functional level related to goals / functional outcomes: See above   Remaining deficits: See goals   Education / Equipment: HEP  Plan: Patient agrees to discharge.  Patient goals were not met. Patient is being discharged due to not returning since the last visit.  ?????       Ahmed Prima, PTA 05/19/17 11:36 AM Mali Applegate MPT [facility] [place], [place], [address] Phone: [phone]   Fax:  [phone]  Name: Claudia Rocha MRN: [id number] Date of Birth: October 19, 1940

## 2017-05-22 ENCOUNTER — Ambulatory Visit (INDEPENDENT_AMBULATORY_CARE_PROVIDER_SITE_OTHER): Payer: Medicare HMO | Admitting: Orthopaedic Surgery

## 2017-06-05 ENCOUNTER — Ambulatory Visit (INDEPENDENT_AMBULATORY_CARE_PROVIDER_SITE_OTHER): Payer: Medicare HMO | Admitting: Orthopaedic Surgery

## 2017-06-05 DIAGNOSIS — Z96652 Presence of left artificial knee joint: Secondary | ICD-10-CM

## 2017-06-05 NOTE — Progress Notes (Signed)
The patient is now 8 weeks status post a left total knee arthroplasty. Her physical therapy was limited by her husband being in the hospital for 11 days due to acute appendicitis. She feels like she is making good progress though in that her range of motion strength are improving. On exam I can only flex her to almost 90 and she lacks full extension by 5. However she feels this is great progress considering 10 days ago she was only flexing to 70. She is doing this on her own at home. Her husband is with her today as well. There is moderate swelling with her knee with no evidence of infection. Calf is soft.  At this point she'll continue the home therapy and exercises that she is doing on her own since she is making progress. We'll see her back in a month to see how her motion is him in a long period of note her flexion on her nonoperative right knee is only to about 90.

## 2017-06-09 ENCOUNTER — Telehealth (INDEPENDENT_AMBULATORY_CARE_PROVIDER_SITE_OTHER): Payer: Self-pay | Admitting: Orthopaedic Surgery

## 2017-06-09 NOTE — Telephone Encounter (Signed)
Gave you Rx. Her Email is    [email or web address]

## 2017-06-09 NOTE — Telephone Encounter (Signed)
That will be fine for them to work with a Systems analystpersonal trainer after her surgery.

## 2017-06-09 NOTE — Telephone Encounter (Signed)
Patient's husband called this morning stating that they are wanting to have a personal trainer to help with her exercises after her surgery.  The personal trainer told him is that they need a release from the doctor stating it is okay for her to work with a Systems analystpersonal trainer.  CB#[phone].  Thank you

## 2017-06-09 NOTE — Telephone Encounter (Signed)
Please advise 

## 2017-07-03 ENCOUNTER — Ambulatory Visit (INDEPENDENT_AMBULATORY_CARE_PROVIDER_SITE_OTHER): Payer: Medicare HMO | Admitting: Orthopaedic Surgery

## 2017-07-03 ENCOUNTER — Encounter (INDEPENDENT_AMBULATORY_CARE_PROVIDER_SITE_OTHER): Payer: Self-pay | Admitting: Orthopaedic Surgery

## 2017-07-03 DIAGNOSIS — Z96652 Presence of left artificial knee joint: Secondary | ICD-10-CM

## 2017-07-03 NOTE — Progress Notes (Signed)
The patient is now 3 months status post a left total knee arthroplasty.  She feels like she is doing great at this point.  She sees a Systems analystpersonal trainer 3 days a week and is getting on exercise bike.  She feels like her motion is back to what it was preoperative.  However her other nonoperative leg only flexes to about 90 degrees.  On exam she has almost full extension her flexion is to about 85 degrees.  She is very pleased with this given the fact that she is walking better and feels great overall.  She does not want any type of manipulation he is back to where her preoperative motion is almost.  That being said I will need to see her back for 3 months.  At that visit I would like an AP and lateral of her left knee.  All questions and concerns were answered and addressed.

## 2017-10-03 ENCOUNTER — Ambulatory Visit (INDEPENDENT_AMBULATORY_CARE_PROVIDER_SITE_OTHER): Payer: Medicare HMO | Admitting: Orthopaedic Surgery

## 2017-10-09 ENCOUNTER — Ambulatory Visit (INDEPENDENT_AMBULATORY_CARE_PROVIDER_SITE_OTHER): Payer: Medicare HMO

## 2017-10-09 ENCOUNTER — Ambulatory Visit (INDEPENDENT_AMBULATORY_CARE_PROVIDER_SITE_OTHER): Payer: Medicare HMO | Admitting: Physician Assistant

## 2017-10-09 ENCOUNTER — Encounter (INDEPENDENT_AMBULATORY_CARE_PROVIDER_SITE_OTHER): Payer: Self-pay | Admitting: Physician Assistant

## 2017-10-09 DIAGNOSIS — Z96652 Presence of left artificial knee joint: Secondary | ICD-10-CM | POA: Diagnosis not present

## 2017-10-09 NOTE — Progress Notes (Signed)
Office Visit Note   Patient: Claudia Rocha           Date of Birth: [birth year]           MRN: [id number] Visit Date: 10/09/2017              Requested by: Vivien Presto, MD [phone] [address] [place], [place] [address] PCP: Vivien Presto, MD   Assessment & Plan: Visit Diagnoses:  1. History of left knee replacement     Plan: She will continue to work on range of motion strengthening of the knee.  Follow-up with [place] in 1 year postop visit at that time we will obtain AP and lateral views of the knee.  Did discuss with her some ways to continue to work on range of motion and quad strengthening particularly swimming and an exercise bike.  Questions encouraged and answered at length.  Follow-Up Instructions: Return in about 6 months (around 04/08/2018) for Radiographs.   Orders:  Orders Placed This Encounter  Procedures  . XR Knee 1-2 Views Left   No orders of the defined types were placed in this encounter.     Procedures: No procedures performed   Clinical Data: No additional findings.   Subjective: Chief Complaint  Patient presents with  . Left Knee - Follow-up    HPI [name] returns today 6 months status post left total knee arthroplasty.  She states overall that her range of motion is improving.  She has no pain in the knee whatsoever.  Still worried about her range of motion.  She has some difficulty going up and down steps having to take them one at that time. Review of Systems See HPI otherwise negative  Objective: Vital Signs: There were no vitals taken for this visit.  Physical Exam  Constitutional: She is oriented to person, place, and time. She appears well-developed and well-nourished. No distress.  Neurological: She is alert and oriented to person, place, and time.  Skin: She is not diaphoretic.  Psychiatric: She has a normal mood and affect.    Ortho Exam Left knee surgical incision is well-healed.  No signs of infection.  She has  full extension flexion to 105 degrees.  No instability valgus varus stressing.  Calf supple nontender. Specialty Comments:  No specialty comments available.  Imaging: Xr Knee 1-2 Views Left  Result Date: 10/09/2017 AP lateral view left knee: Well seated total knee arthroplasty without any signs of complications.  No acute fracture.    PMFS History: Patient Active Problem List   Diagnosis Date Noted  . Unilateral primary osteoarthritis, left knee 04/04/2017  . Status post total knee replacement, left 04/04/2017  . Acquired hypothyroidism 07/24/2014  . Carotid artery stenosis 09/12/2012  . Pounding in head 04/23/2012  . Hypersomnolence 04/23/2012  . Syncope and collapse 12/27/2011  . Essential hypertension 12/27/2011   Past Medical History:  Diagnosis Date  . Arthritis    spine  . Diabetes mellitus without complication (HCC)    type II  . Heart murmur    patient denies  . Hypertension    new onset   . Hypothyroidism   . Pulmonary embolism (HCC)    2016, was on eliquis immediately after, no longer on this  . Syncope     No family history on file.  Past Surgical History:  Procedure Laterality Date  . ABDOMINAL HYSTERECTOMY    . CATARACT EXTRACTION    . TONSILLECTOMY    . TOTAL  KNEE ARTHROPLASTY Left 04/04/2017   Procedure: LEFT TOTAL KNEE ARTHROPLASTY;  Surgeon: Kathryne HitchBlackman, Christopher Y, MD;  [facility];  Service: Orthopedics;  Laterality: Left;  . TUBAL LIGATION     Social History   Occupational History  . Not on file  Tobacco Use  . Smoking status: Never Smoker  . Smokeless tobacco: Never Used  Substance and Sexual Activity  . Alcohol use: No  . Drug use: No  . Sexual activity: Not Currently    Birth control/protection: Post-menopausal

## 2018-02-21 IMAGING — DX DG KNEE 1-2V PORT*L*
2 series · 2 of 2 positions shown · non-contrast
Comparison: Left knee films of 03/13/2017

CLINICAL DATA: Post left total knee replacement

EXAM:
PORTABLE LEFT KNEE - 1-2 VIEW

[knee ap]
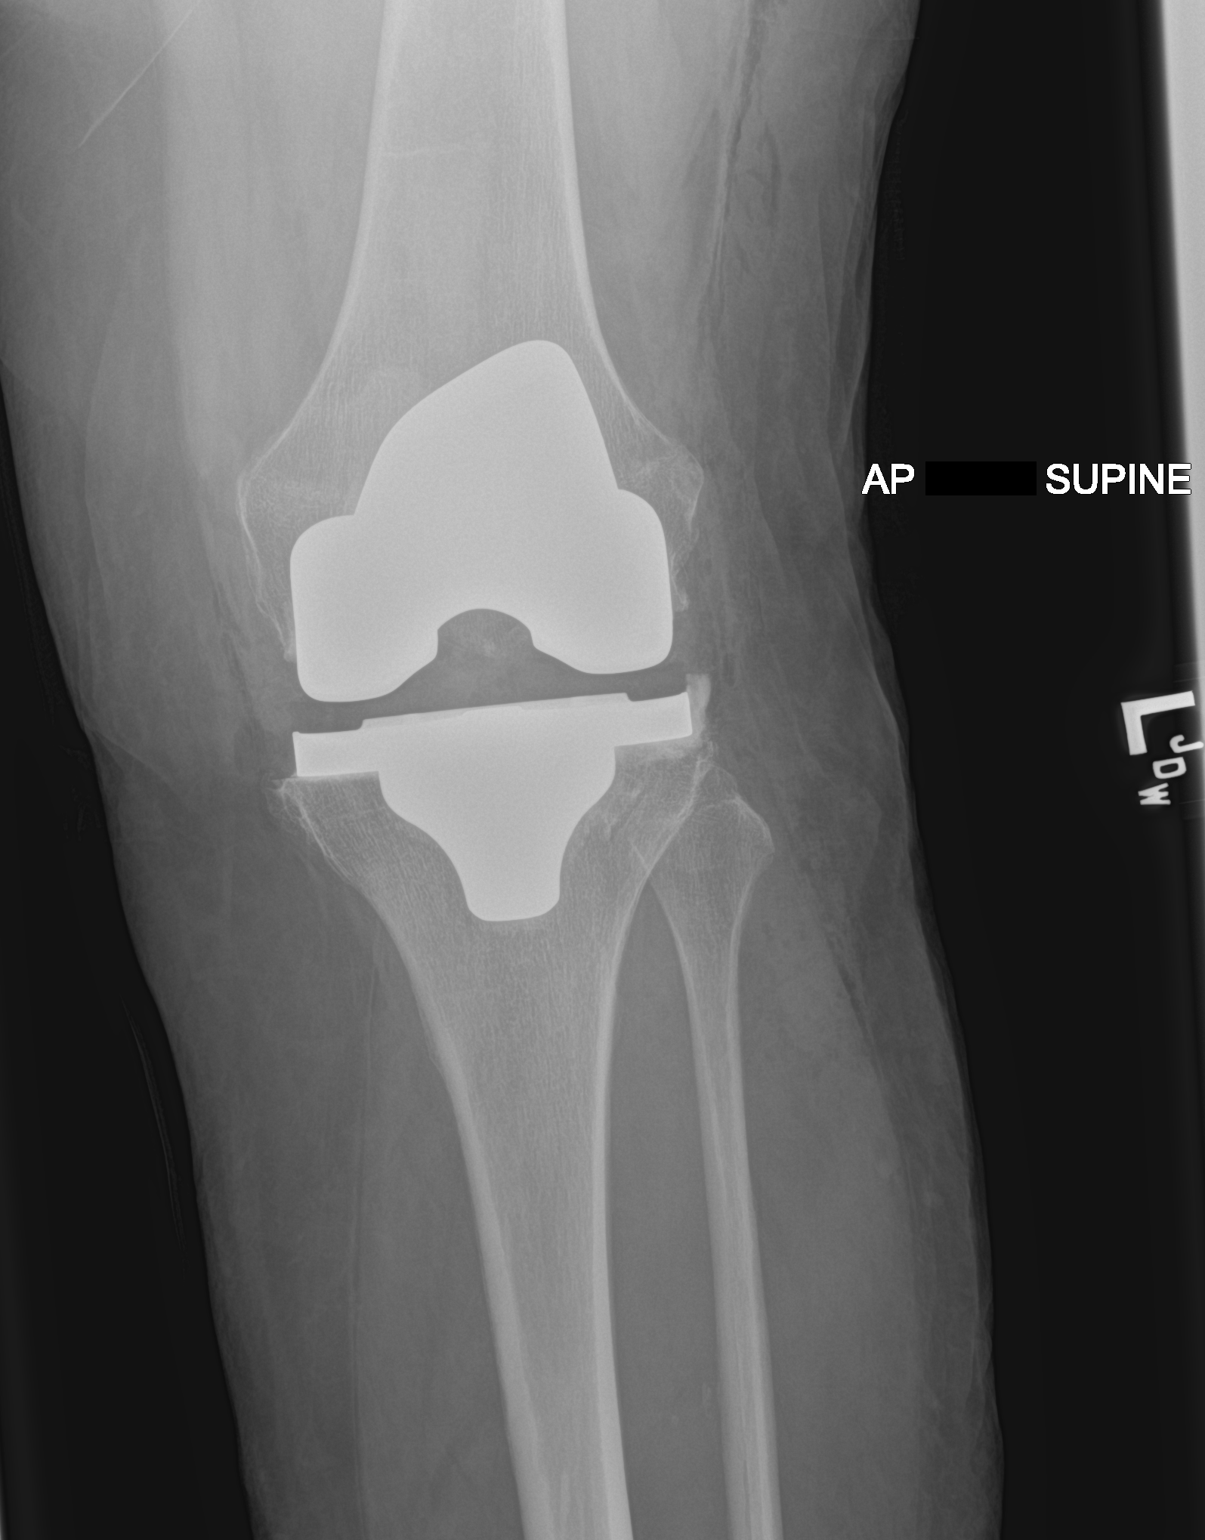

[knee lat]
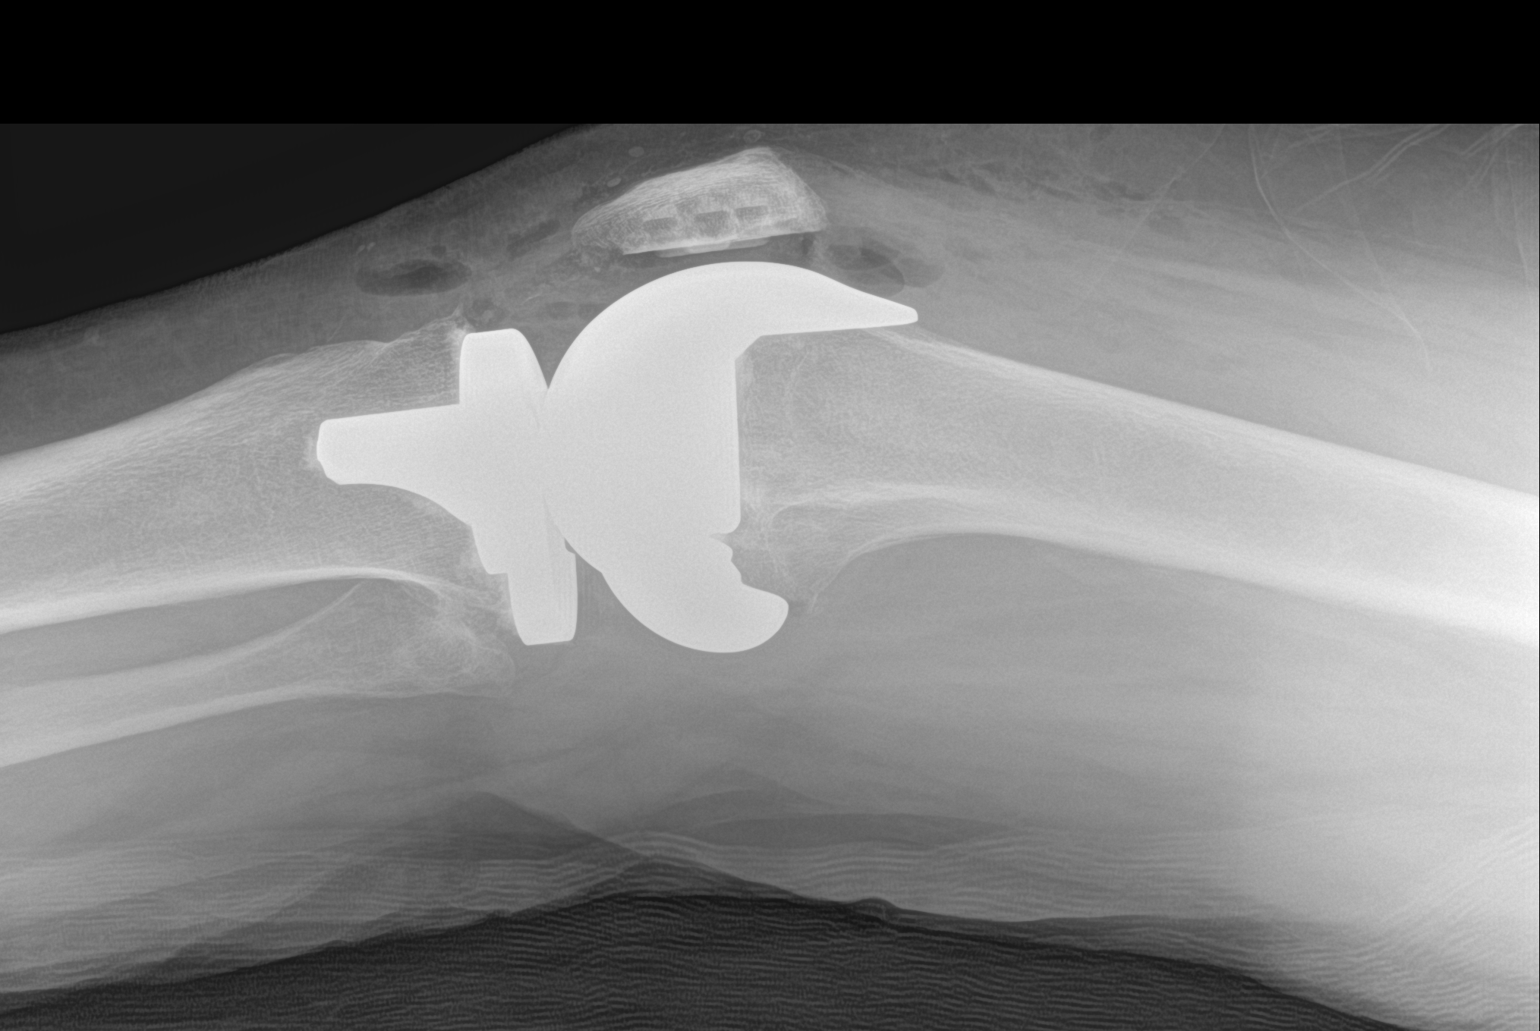

[2 of 2 positions shown; findings below may reference images not displayed]

FINDINGS: The femoral and tibial components of the left total knee replacement
are in good position and alignment. No complicating features are
seen. There is some air in the soft tissues and joint space
postoperatively.
IMPRESSION: Left total knee replacement components in good position and
alignment. No complicating features.

## 2018-04-09 ENCOUNTER — Ambulatory Visit (INDEPENDENT_AMBULATORY_CARE_PROVIDER_SITE_OTHER): Payer: Medicare HMO | Admitting: Physician Assistant

## 2018-04-09 ENCOUNTER — Encounter (INDEPENDENT_AMBULATORY_CARE_PROVIDER_SITE_OTHER): Payer: Self-pay | Admitting: Physician Assistant

## 2018-04-09 ENCOUNTER — Ambulatory Visit (INDEPENDENT_AMBULATORY_CARE_PROVIDER_SITE_OTHER): Payer: Medicare HMO

## 2018-04-09 DIAGNOSIS — Z96652 Presence of left artificial knee joint: Secondary | ICD-10-CM

## 2018-04-09 NOTE — Progress Notes (Signed)
Office Visit Note   Patient: Claudia Rocha           Date of Birth: Aug 01, 1941           MRN: [id number] Visit Date: 04/09/2018              Requested by: Vivien Prestoorrington, Kip A, MD [phone] [address] [place], [place] [id number] PCP: Vivien Prestoorrington, Kip A, MD   Assessment & Plan: Visit Diagnoses:  1. History of left knee replacement     Plan: We will send her to therapy for quad strengthening, gait/balance and stair training.  See her back in 6 months check progress lack of.  No radiographs at that time.  Follow-Up Instructions: Return in about 6 months (around 10/10/2018).   Orders:  Orders Placed This Encounter  Procedures  . XR Knee 1-2 Views Left   No orders of the defined types were placed in this encounter.     Procedures: No procedures performed   Clinical Data: No additional findings.   Subjective: Chief Complaint  Patient presents with  . Left Knee - Follow-up    HPI Claudia Rocha returns 1 year status post left total knee arthroplasty.  She states that she has no pain at all in the knee.  However she is still not happy with the results as she has problems with stairs still.  Her husband states that she is typically getting in and out of the chair on her own and pushes up with her arms.  No fevers chills shortness of breath chest pain Review of Systems Please see HPI otherwise negative  Objective: Vital Signs: There were no vitals taken for this visit.  Physical Exam  Constitutional: She is oriented to person, place, and time. She appears well-developed and well-nourished. No distress.  Pulmonary/Chest: Effort normal.  Neurological: She is alert and oriented to person, place, and time.  Skin: She is not diaphoretic.    Ortho Exam Left knee full extension flexion to 110 degrees.  No instability valgus varus stressing.  No abnormal warmth erythema ecchymosis or effusion.  Calf supple nontender.  Surgical incisions well-healed.  She is able to stand on her own  without pushing off on the chair does this repeatedly. Specialty Comments:  No specialty comments available.  Imaging: Xr Knee 1-2 Views Left  Result Date: 04/09/2018 AP lateral views left knee: No acute fracture.  Well-seated total knee replacement components.  Knees well located.    PMFS History: Patient Active Problem List   Diagnosis Date Noted  . Unilateral primary osteoarthritis, left knee 04/04/2017  . Status post total knee replacement, left 04/04/2017  . Acquired hypothyroidism 07/24/2014  . Carotid artery stenosis 09/12/2012  . Pounding in head 04/23/2012  . Hypersomnolence 04/23/2012  . Syncope and collapse 12/27/2011  . Essential hypertension 12/27/2011   Past Medical History:  Diagnosis Date  . Arthritis    spine  . Diabetes mellitus without complication (HCC)    type II  . Heart murmur    patient denies  . Hypertension    new onset   . Hypothyroidism   . Pulmonary embolism (HCC)    2016, was on eliquis immediately after, no longer on this  . Syncope     History reviewed. No pertinent family history.  Past Surgical History:  Procedure Laterality Date  . ABDOMINAL HYSTERECTOMY    . CATARACT EXTRACTION    . TONSILLECTOMY    . TOTAL KNEE ARTHROPLASTY Left 04/04/2017   Procedure: LEFT  TOTAL KNEE ARTHROPLASTY;  Surgeon: Kathryne Hitch, MD;  Location: [facility];  Service: Orthopedics;  Laterality: Left;  . TUBAL LIGATION     Social History   Occupational History  . Not on file  Tobacco Use  . Smoking status: Never Smoker  . Smokeless tobacco: Never Used  Substance and Sexual Activity  . Alcohol use: No  . Drug use: No  . Sexual activity: Not Currently    Birth control/protection: Post-menopausal

## 2018-04-17 ENCOUNTER — Encounter: Payer: Self-pay | Admitting: Physical Therapy

## 2018-04-17 ENCOUNTER — Other Ambulatory Visit: Payer: Self-pay

## 2018-04-17 ENCOUNTER — Ambulatory Visit: Payer: Medicare HMO | Attending: Physician Assistant | Admitting: Physical Therapy

## 2018-04-17 DIAGNOSIS — R2689 Other abnormalities of gait and mobility: Secondary | ICD-10-CM | POA: Insufficient documentation

## 2018-04-17 DIAGNOSIS — M6281 Muscle weakness (generalized): Secondary | ICD-10-CM | POA: Diagnosis present

## 2018-04-17 DIAGNOSIS — M25662 Stiffness of left knee, not elsewhere classified: Secondary | ICD-10-CM | POA: Diagnosis present

## 2018-04-17 DIAGNOSIS — R262 Difficulty in walking, not elsewhere classified: Secondary | ICD-10-CM | POA: Diagnosis present

## 2018-04-17 NOTE — Therapy (Addendum)
[facility] [address] [place], [place], [address] Phone: [phone]   Fax:  [phone]  Physical Therapy Evaluation  Patient Details  Name: Claudia Rocha MRN: [id number] Date of Birth: 05-Dec-1940 Referring Provider: Doneen Poisson, MD   Encounter Date: 04/17/2018  PT End of Session - 04/17/18 0901    Visit Number  1    Number of Visits  12    Date for PT Re-Evaluation  06/05/18    Authorization Type  FOTO every 5th visit, progress note every 10th visit, KX modifier at 15th visit    PT Start Time  0817    PT Stop Time  0900    PT Time Calculation (min)  43 min    Activity Tolerance  Patient tolerated treatment well    Behavior During Therapy  [facility] for tasks assessed/performed       Past Medical History:  Diagnosis Date  . Arthritis    spine  . Diabetes mellitus without complication (HCC)    type II  . Heart murmur    patient denies  . Hypertension    new onset   . Hypothyroidism   . Pulmonary embolism (HCC)    2016, was on eliquis immediately after, no longer on this  . Syncope     Past Surgical History:  Procedure Laterality Date  . ABDOMINAL HYSTERECTOMY    . CATARACT EXTRACTION    . TONSILLECTOMY    . TOTAL KNEE ARTHROPLASTY Left 04/04/2017   Procedure: LEFT TOTAL KNEE ARTHROPLASTY;  Surgeon: Kathryne Hitch, MD;  Location: [facility] OR;  Service: Orthopedics;  Laterality: Left;  . TUBAL LIGATION      There were no vitals filed for this visit.   Subjective Assessment - 04/17/18 1245    Subjective  Patient arrives to physical therapy with reports of difficulty walking, difficulty with negotiating step, and transitioning from sit to stand without UE support s/p left TKA 04/04/17. Patient reports pain is not a limiting factor. Patient reports difficulties with negotiating steps within her home and states she can ascend with reciprocating gait pattern with two hand rails but requires step to step pattern with  2 hand rails to descend. Patient states transitioning from sit to stand requires UE support secondary to feeling weak. Patient's goals would be to ambulate without gait deviations, negotiate steps with greater ease, and strengthen her left LE.    Pertinent History  L TKA 04/04/17; HTN, DM    Limitations  Walking    How long can you sit comfortably?  unlimited    How long can you stand comfortably?  unlimited    How long can you walk comfortably?  unlimited but walks with abnormal gait pattern    Patient Stated Goals  Walk normally and strengthen knee to walk up/down steps easier    Currently in Pain?  No/denies         [facility] PT Assessment - 04/17/18 0001      Assessment   Medical Diagnosis  Left total knee replacement.    Referring Provider  Doneen Poisson, MD    Onset Date/Surgical Date  04/04/17    Next MD Visit  09/2018    Prior Therapy  yes      Precautions   Precautions  None      Restrictions   Weight Bearing Restrictions  No      Balance Screen   Has the patient fallen in the past 6 months  No    Has  the patient had a decrease in activity level because of a fear of falling?   No    Is the patient reluctant to leave their home because of a fear of falling?   No      Home Public house manager residence    Living Arrangements  Spouse/significant other    Type of Home  House    Home Layout  Two level    Alternate Level Stairs-Number of Steps  12    Alternate Level Stairs-Rails  Right;Left;Can reach both      Prior Function   Level of Independence  Independent      Observation/Other Assessments   Focus on Therapeutic Outcomes (FOTO)   33% limited      ROM / Strength   AROM / PROM / Strength  AROM;PROM;Strength      AROM   AROM Assessment Site  Knee    Right/Left Knee  Left    Left Knee Extension  0    Left Knee Flexion  90      PROM   PROM Assessment Site  Knee    Right/Left Knee  Left    Left Knee Extension  0    Left Knee Flexion   92      Strength   Strength Assessment Site  Knee    Right/Left Knee  Left    Left Knee Flexion  4/5    Left Knee Extension  4/5      Palpation   Patella mobility  WNL, 3/6    Palpation comment  no tenderness upon palpation      Transfers   Five time sit to stand comments   5x sit to stand test 16 seconds      Ambulation/Gait   Gait Pattern  Step-to pattern;Decreased stride length;Decreased hip/knee flexion - left;Decreased weight shift to left;Trunk flexed;Wide base of support;Decreased stance time - left;Trendelenburg      Balance   Balance Assessed  Yes   (-) Romberg; Left SLS time 15 seconds        Nustep Level 5 x10 minutes no UEs       Objective measurements completed on examination: See above findings.              PT Education - 04/17/18 1121    Education provided  Yes    Education Details  LAQ, eccentric quads, SLR, SLR with ER    Person(s) Educated  Patient    Methods  Explanation;Demonstration;Verbal cues;Handout    Comprehension  Verbalized understanding;Returned demonstration       PT Short Term Goals - 04/17/18 1249      PT SHORT TERM GOAL #1   Title  STG=LTG        PT Long Term Goals - 04/17/18 1249      PT LONG TERM GOAL #1   Title  Patient will be independent with HEP    Time  6    Period  Weeks    Status  New      PT LONG TERM GOAL #2   Title  Patient will demonstrate 5/5 left knee MMT to improve stability during functional tasks.    Time  6    Period  Weeks    Status  New      PT LONG TERM GOAL #3   Title  Patient will negotiate steps with reciprocating gait pattern and one railing safely access various levels of her home.  Time  6    Period  Weeks    Status  New      PT LONG TERM GOAL #4   Title  Patient will improve 5x sit to stand time to 12 seconds to improve ability to sit to stand and decrease risk of falls.    Time  6    Period  Weeks    Status  New      PT LONG TERM GOAL #5   Title  Patient will  demonstrate 115 degrees of active left knee flexion ROM to improve ability to perform functional tasks.     Time  6    Period  Weeks    Status  New      PT LONG TERM GOAL #6   Title  Patient will ambulate with a normalized gait pattern.     Time  6    Period  Weeks    Status  New             Plan - 04/17/18 1246    Clinical Impression Statement  Patient is a 77 year old female who presents to physical therapy with decreased L LE strength and abnormal gait pattern s/p left TKA 04/04/17. Patient's 5x sit to stand score of 16 seconds categorizes her as a low fall risk with decreased LE strength. Patient noted with decreased AROM and PROM knee flexion. Patient ambulates with decreased stride length, decreased left stance time, decreased knee flexion during swing phase, as well as wide base of support and Trendelenburg gait. Patient (-) Romberg and left SLS time is 15 seconds. Patient would benefit from skilled physical therapy to improve gait mechanics, improve strength and improve ability to perform sit to stands.    History and Personal Factors relevant to plan of care:   Left TKA 04/04/17, HTN, DM    Clinical Presentation  Stable    Clinical Decision Making  Low    Rehab Potential  Good    PT Frequency  2x / week    PT Duration  6 weeks    PT Treatment/Interventions  ADLs/Self Care Home Management;Cryotherapy;Electrical Stimulation;Gait training;Stair training;Neuromuscular re-education;Therapeutic exercise;Therapeutic activities;Functional mobility training;Patient/family education;Manual techniques;Passive range of motion;Vasopneumatic Device;Moist Heat;Balance training    PT Next Visit Plan  Nustep, L knee Quad strengthening, steps ups, modalities as needed for pain     PT Home Exercise Plan  LAQs, eccentric LAQ, SLR, SLR with ER    Consulted and Agree with Plan of Care  Patient       Patient will benefit from skilled therapeutic intervention in order to improve the following  deficits and impairments:  Abnormal gait, Difficulty walking, Decreased range of motion, Decreased strength, Decreased endurance  Visit Diagnosis: Muscle weakness (generalized) - Plan: PT plan of care cert/re-cert  Stiffness of left knee, not elsewhere classified - Plan: PT plan of care cert/re-cert  Difficulty in walking, not elsewhere classified - Plan: PT plan of care cert/re-cert  Other abnormalities of gait and mobility - Plan: PT plan of care cert/re-cert     Problem List Patient Active Problem List   Diagnosis Date Noted  . Unilateral primary osteoarthritis, left knee 04/04/2017  . Status post total knee replacement, left 04/04/2017  . Acquired hypothyroidism 07/24/2014  . Carotid artery stenosis 09/12/2012  . Pounding in head 04/23/2012  . Hypersomnolence 04/23/2012  . Syncope and collapse 12/27/2011  . Essential hypertension 12/27/2011    Guss Bunde, PT, DPT 04/17/2018, 12:59 PM   [facility] [address] [place], [place], [address] Phone: [phone]   Fax:  [phone]  Name: Claudia Rocha MRN: [id number] Date of Birth: [birth year]

## 2018-04-20 ENCOUNTER — Ambulatory Visit: Payer: Medicare HMO | Admitting: Physical Therapy

## 2018-04-20 DIAGNOSIS — M25662 Stiffness of left knee, not elsewhere classified: Secondary | ICD-10-CM

## 2018-04-20 DIAGNOSIS — M6281 Muscle weakness (generalized): Secondary | ICD-10-CM | POA: Diagnosis not present

## 2018-04-20 DIAGNOSIS — R262 Difficulty in walking, not elsewhere classified: Secondary | ICD-10-CM

## 2018-04-20 NOTE — Therapy (Signed)
[facility] [address] [place], [place], [address] Phone: [phone]   Fax:  [phone]  Physical Therapy Treatment  Patient Details  Name: Claudia Rocha MRN: [id number] Date of Birth: [birth year] Referring Provider: Doneen Poisson, MD   Encounter Date: 04/20/2018  PT End of Session - 04/20/18 0819    Visit Number  2    Number of Visits  12    Date for PT Re-Evaluation  06/05/18    Authorization Type  FOTO every 5th visit, progress note every 10th visit, KX modifier at 15th visit    PT Start Time  0815    PT Stop Time  0901    PT Time Calculation (min)  46 min    Activity Tolerance  Patient tolerated treatment well    Behavior During Therapy  [facility] for tasks assessed/performed       Past Medical History:  Diagnosis Date  . Arthritis    spine  . Diabetes mellitus without complication (HCC)    type II  . Heart murmur    patient denies  . Hypertension    new onset   . Hypothyroidism   . Pulmonary embolism (HCC)    2016, was on eliquis immediately after, no longer on this  . Syncope     Past Surgical History:  Procedure Laterality Date  . ABDOMINAL HYSTERECTOMY    . CATARACT EXTRACTION    . TONSILLECTOMY    . TOTAL KNEE ARTHROPLASTY Left 04/04/2017   Procedure: LEFT TOTAL KNEE ARTHROPLASTY;  Surgeon: Kathryne Hitch, MD;  Location: [facility] OR;  Service: Orthopedics;  Laterality: Left;  . TUBAL LIGATION      There were no vitals filed for this visit.  Subjective Assessment - 04/20/18 0817    Subjective  Patient states feeling no pain, just sore.     Pertinent History  L TKA 04/04/17; HTN, DM    Limitations  Walking    How long can you sit comfortably?  unlimited    How long can you stand comfortably?  unlimited    How long can you walk comfortably?  unlimited but walks with abnormal gait pattern    Patient Stated Goals  Walk normally and strengthen knee to walk up/down steps easier    Currently in Pain?   Yes    Pain Score  9    scale of soreness, no pain   Pain Location  Knee    Pain Orientation  Left    Pain Descriptors / Indicators  Sore    Pain Frequency  Intermittent         [facility] PT Assessment - 04/20/18 0001      Assessment   Medical Diagnosis  Left total knee replacement.    Next MD Visit  09/2018    Prior Therapy  yes                   [facility] Adult PT Treatment/Exercise - 04/20/18 0001      Exercises   Exercises  Knee/Hip      Knee/Hip Exercises: Stretches   Passive Hamstring Stretch  Left;3 reps;30 seconds    Hip Flexor Stretch  Left;3 reps;30 seconds    Hip Flexor Stretch Limitations  on step    Knee: Self-Stretch to increase Flexion  Both;10 seconds;Other (comment)   for 1 minute   Knee: Self-Stretch Limitations  on step      Knee/Hip Exercises: Aerobic   Nustep  Level 4 x12 minutes no UE  Knee/Hip Exercises: Standing   Knee Flexion  Left;20 reps   1# weight   Hip Flexion  AROM;Stengthening;Both;Knee bent;Other (comment)   1#    Forward Step Up  Left;2 sets;10 reps;Hand Hold: 2;Step Height: 6"    Step Down  Left;2 sets;10 reps;Hand Hold: 2;Step Height: 4"   lateral step downs   Rocker Board  3 minutes               PT Short Term Goals - 04/17/18 1249      PT SHORT TERM GOAL #1   Title  STG=LTG        PT Long Term Goals - 04/17/18 1249      PT LONG TERM GOAL #1   Title  Patient will be independent with HEP    Time  6    Period  Weeks    Status  New      PT LONG TERM GOAL #2   Title  Patient will demonstrate 5/5 left knee MMT to improve stability during functional tasks.    Time  6    Period  Weeks    Status  New      PT LONG TERM GOAL #3   Title  Patient will negotiate steps with reciprocating gait pattern and one railing safely access various levels of her home.     Time  6    Period  Weeks    Status  New      PT LONG TERM GOAL #4   Title  Patient will improve 5x sit to stand time to 12 seconds to improve  ability to sit to stand and decrease risk of falls.    Time  6    Period  Weeks    Status  New      PT LONG TERM GOAL #5   Title  Patient will demonstrate 115 degrees of active left knee flexion ROM to improve ability to perform functional tasks.     Time  6    Period  Weeks    Status  New      PT LONG TERM GOAL #6   Title  Patient will ambulate with a normalized gait pattern.     Time  6    Period  Weeks    Status  New            Plan - 04/20/18 0913    Clinical Impression Statement  Patient was able to tolerate treatment well with no reports of increased soreness. Patient required intermittent verbal cues for proper form and technique. Patient was able to perform improved form after cuing. Patient provided with HEP consisting of LE stretches. Patient reported understanding.     Clinical Presentation  Stable    Clinical Decision Making  Low    Rehab Potential  Good    PT Frequency  2x / week    PT Duration  6 weeks    PT Treatment/Interventions  ADLs/Self Care Home Management;Cryotherapy;Electrical Stimulation;Gait training;Stair training;Neuromuscular re-education;Therapeutic exercise;Therapeutic activities;Functional mobility training;Patient/family education;Manual techniques;Passive range of motion;Vasopneumatic Device;Moist Heat;Balance training    PT Next Visit Plan  Nustep, L knee Quad strengthening, steps ups, modalities as needed for pain     Consulted and Agree with Plan of Care  Patient       Patient will benefit from skilled therapeutic intervention in order to improve the following deficits and impairments:  Abnormal gait, Difficulty walking, Decreased range of motion, Decreased strength, Decreased endurance  Visit Diagnosis: Muscle weakness (  generalized)  Stiffness of left knee, not elsewhere classified  Difficulty in walking, not elsewhere classified     Problem List Patient Active Problem List   Diagnosis Date Noted  . Unilateral primary  osteoarthritis, left knee 04/04/2017  . Status post total knee replacement, left 04/04/2017  . Acquired hypothyroidism 07/24/2014  . Carotid artery stenosis 09/12/2012  . Pounding in head 04/23/2012  . Hypersomnolence 04/23/2012  . Syncope and collapse 12/27/2011  . Essential hypertension 12/27/2011   Guss Bunde, PT, DPT 04/20/2018, 11:20 AM  [facility] [address] [place], [place], [address] Phone: [phone]   Fax:  [phone]  Name: Claudia Rocha MRN: [id number] Date of Birth: June 23, 1941

## 2018-04-23 ENCOUNTER — Encounter: Payer: Self-pay | Admitting: Physical Therapy

## 2018-04-23 ENCOUNTER — Ambulatory Visit: Payer: Medicare HMO | Admitting: Physical Therapy

## 2018-04-23 DIAGNOSIS — M25662 Stiffness of left knee, not elsewhere classified: Secondary | ICD-10-CM

## 2018-04-23 DIAGNOSIS — R262 Difficulty in walking, not elsewhere classified: Secondary | ICD-10-CM

## 2018-04-23 DIAGNOSIS — M6281 Muscle weakness (generalized): Secondary | ICD-10-CM | POA: Diagnosis not present

## 2018-04-23 NOTE — Therapy (Signed)
[facility] [address] [place], [place], [address] Phone: [phone]   Fax:  [phone]  Physical Therapy Treatment  Patient Details  Name: Claudia Rocha MRN: [id number] Date of Birth: [birth year] Referring Provider: Doneen Poisson, MD   Encounter Date: 04/23/2018  PT End of Session - 04/23/18 0740    Visit Number  3    Number of Visits  12    Date for PT Re-Evaluation  06/05/18    Authorization Type  FOTO every 5th visit, progress note every 10th visit, KX modifier at 15th visit    PT Start Time  0730    PT Stop Time  0817    PT Time Calculation (min)  47 min    Activity Tolerance  Patient tolerated treatment well    Behavior During Therapy  [facility] for tasks assessed/performed       Past Medical History:  Diagnosis Date  . Arthritis    spine  . Diabetes mellitus without complication (HCC)    type II  . Heart murmur    patient denies  . Hypertension    new onset   . Hypothyroidism   . Pulmonary embolism (HCC)    2016, was on eliquis immediately after, no longer on this  . Syncope     Past Surgical History:  Procedure Laterality Date  . ABDOMINAL HYSTERECTOMY    . CATARACT EXTRACTION    . TONSILLECTOMY    . TOTAL KNEE ARTHROPLASTY Left 04/04/2017   Procedure: LEFT TOTAL KNEE ARTHROPLASTY;  Surgeon: Kathryne Hitch, MD;  Location: [facility] OR;  Service: Orthopedics;  Laterality: Left;  . TUBAL LIGATION      There were no vitals filed for this visit.  Subjective Assessment - 04/23/18 0739    Subjective  Patient reports feeling sore and she has been performing exercises at home.    Pertinent History  L TKA 04/04/17; HTN, DM    Limitations  Walking    How long can you sit comfortably?  unlimited    How long can you stand comfortably?  unlimited    How long can you walk comfortably?  unlimited but walks with abnormal gait pattern    Patient Stated Goals  Walk normally and strengthen knee to walk up/down steps  easier    Currently in Pain?  Yes    Pain Score  9    soreness, not pain   Pain Location  Knee    Pain Orientation  Left    Pain Descriptors / Indicators  Sore    Pain Type  Chronic pain    Pain Onset  More than a month ago    Pain Frequency  Intermittent         [facility] PT Assessment - 04/23/18 0001      Assessment   Medical Diagnosis  Left total knee replacement.    Next MD Visit  09/2018    Prior Therapy  yes                   [facility] Adult PT Treatment/Exercise - 04/23/18 0001      Exercises   Exercises  Knee/Hip      Knee/Hip Exercises: Aerobic   Nustep  Level 5 x15 minutes      Knee/Hip Exercises: Machines for Strengthening   Cybex Knee Extension  10# x3 minutes     Cybex Knee Flexion  20# x3 minutes      Knee/Hip Exercises: Standing   Hip Abduction  AROM;Stengthening;Both;3 sets;10 reps;Knee straight    Forward Step Up  Left;2 sets;10 reps;Hand Hold: 2;Step Height: 6"    Step Down  Left;2 sets;10 reps;Hand Hold: 2;Step Height: 2"    Rocker Board  3 minutes               PT Short Term Goals - 04/17/18 1249      PT SHORT TERM GOAL #1   Title  STG=LTG        PT Long Term Goals - 04/17/18 1249      PT LONG TERM GOAL #1   Title  Patient will be independent with HEP    Time  6    Period  Weeks    Status  New      PT LONG TERM GOAL #2   Title  Patient will demonstrate 5/5 left knee MMT to improve stability during functional tasks.    Time  6    Period  Weeks    Status  New      PT LONG TERM GOAL #3   Title  Patient will negotiate steps with reciprocating gait pattern and one railing safely access various levels of her home.     Time  6    Period  Weeks    Status  New      PT LONG TERM GOAL #4   Title  Patient will improve 5x sit to stand time to 12 seconds to improve ability to sit to stand and decrease risk of falls.    Time  6    Period  Weeks    Status  New      PT LONG TERM GOAL #5   Title  Patient will demonstrate 115  degrees of active left knee flexion ROM to improve ability to perform functional tasks.     Time  6    Period  Weeks    Status  New      PT LONG TERM GOAL #6   Title  Patient will ambulate with a normalized gait pattern.     Time  6    Period  Weeks    Status  New            Plan - 04/23/18 1610    Clinical Impression Statement  Patient's husband had inquired questions regarding plan of care prior to the start of treatment. Patient and patient's husband educated on plan of care and informed the importance of practicing steps at a shorter step height to gain control first. They were also educated on the importance of maintaining adequate knee flexion to prevent hip circumduction while ascending steps. Patient's husband reported understanding. Patient was able to tolerate treatment well however required verbal cues, tactile cues and demonstration for proper form and technique. Patient educated the importance of proper form rather than to compensate with lateral side bending with hip abduction. Patient reported understanding.     Clinical Presentation  Stable    Clinical Decision Making  Low    Rehab Potential  Good    PT Frequency  2x / week    PT Duration  6 weeks    PT Treatment/Interventions  ADLs/Self Care Home Management;Cryotherapy;Electrical Stimulation;Gait training;Stair training;Neuromuscular re-education;Therapeutic exercise;Therapeutic activities;Functional mobility training;Patient/family education;Manual techniques;Passive range of motion;Vasopneumatic Device;Moist Heat;Balance training    PT Next Visit Plan  Cont with POC with Nustep, L knee Quad strengthening, steps ups, modalities as needed for pain     PT Home Exercise Plan  LAQs, eccentric LAQ, SLR,  SLR with ER    Consulted and Agree with Plan of Care  Patient       Patient will benefit from skilled therapeutic intervention in order to improve the following deficits and impairments:  Abnormal gait, Difficulty walking,  Decreased range of motion, Decreased strength, Decreased endurance  Visit Diagnosis: Muscle weakness (generalized)  Stiffness of left knee, not elsewhere classified  Difficulty in walking, not elsewhere classified     Problem List Patient Active Problem List   Diagnosis Date Noted  . Unilateral primary osteoarthritis, left knee 04/04/2017  . Status post total knee replacement, left 04/04/2017  . Acquired hypothyroidism 07/24/2014  . Carotid artery stenosis 09/12/2012  . Pounding in head 04/23/2012  . Hypersomnolence 04/23/2012  . Syncope and collapse 12/27/2011  . Essential hypertension 12/27/2011   Guss Bunde, PT, DPT 04/23/2018, 8:29 AM  [facility] [address] [place], [place], [address] Phone: [phone]   Fax:  [phone]  Name: Claudia Rocha MRN: [id number] Date of Birth: Jun 07, 1941

## 2018-04-24 ENCOUNTER — Encounter: Payer: Self-pay | Admitting: Physical Therapy

## 2018-04-24 ENCOUNTER — Ambulatory Visit: Payer: Medicare HMO | Admitting: Physical Therapy

## 2018-04-24 DIAGNOSIS — M6281 Muscle weakness (generalized): Secondary | ICD-10-CM

## 2018-04-24 DIAGNOSIS — M25662 Stiffness of left knee, not elsewhere classified: Secondary | ICD-10-CM

## 2018-04-24 DIAGNOSIS — R262 Difficulty in walking, not elsewhere classified: Secondary | ICD-10-CM

## 2018-04-24 NOTE — Therapy (Addendum)
[facility] [place], [place], [address] Phone: [phone]   Fax:  [phone]  Physical Therapy Treatment/Discharge  PHYSICAL THERAPY DISCHARGE SUMMARY  Visits from Start of Care: 4  Current functional level related to goals / functional outcomes: See below   Remaining deficits: Goals not met   Education / Equipment: HEP Plan: Patient agrees to discharge.  Patient goals were not met. Patient is being discharged due to not returning since the last visit.  ?????    Gabriela Eves, PT, DPT 09/11/18   Patient Details  Name: Claudia Rocha MRN: [id number] Date of Birth: 1941/04/14 Referring Provider: Jean Rosenthal, MD   Encounter Date: 04/24/2018  PT End of Session - 04/24/18 0742    Visit Number  4    Number of Visits  12    Date for PT Re-Evaluation  06/05/18    Authorization Type  FOTO every 5th visit, progress note every 10th visit, KX modifier at 15th visit    PT Start Time  0728    PT Stop Time  0812    PT Time Calculation (min)  44 min    Activity Tolerance  Patient tolerated treatment well    Behavior During Therapy  [facility] for tasks assessed/performed       Past Medical History:  Diagnosis Date  . Arthritis    spine  . Diabetes mellitus without complication ([place])    type II  . Heart murmur    patient denies  . Hypertension    new onset   . Hypothyroidism   . Pulmonary embolism ([place])    2016, was on eliquis immediately after, no longer on this  . Syncope     Past Surgical History:  Procedure Laterality Date  . ABDOMINAL HYSTERECTOMY    . CATARACT EXTRACTION    . TONSILLECTOMY    . TOTAL KNEE ARTHROPLASTY Left 04/04/2017   Procedure: LEFT TOTAL KNEE ARTHROPLASTY;  Surgeon: Mcarthur Rossetti, MD;  Location: [facility];  Service: Orthopedics;  Laterality: Left;  . TUBAL LIGATION      There were no vitals filed for this visit.  Subjective Assessment - 04/24/18 0741    Subjective   Patient reported feeling good, but sore.     Pertinent History  L TKA 04/04/17; HTN, DM    Limitations  Walking    How long can you sit comfortably?  unlimited    How long can you stand comfortably?  unlimited    How long can you walk comfortably?  unlimited but walks with abnormal gait pattern    Patient Stated Goals  Walk normally and strengthen knee to walk up/down steps easier    Currently in Pain?  Yes    Pain Score  --   did not provide        [facility] PT Assessment - 04/24/18 0001      Assessment   Medical Diagnosis  Left total knee replacement.    Next MD Visit  09/2018    Prior Therapy  yes                   [facility] Adult PT Treatment/Exercise - 04/24/18 0001      Exercises   Exercises  Knee/Hip      Knee/Hip Exercises: Stretches   Passive Hamstring Stretch  Left;3 reps;30 seconds    Hip Flexor Stretch  Left;3 reps;30 seconds    Hip Flexor Stretch Limitations  on step    Knee: Self-Stretch to  increase Flexion  Both;30 seconds;3 reps    Knee: Self-Stretch Limitations  on step      Knee/Hip Exercises: Aerobic   Nustep  Level 5 x15 minutes no UEs      Knee/Hip Exercises: Standing   Forward Lunges  Both;10 reps    Forward Step Up  Left;2 sets;10 reps;Step Height: 6";Hand Hold: 1      Knee/Hip Exercises: Seated   Sit to Sand  2 sets;10 reps;without UE support      Manual Therapy   Manual Therapy  Joint mobilization;Passive ROM    Joint Mobilization  patella mobs in all planes to improve ROM    Passive ROM  PROM to L knee with holds at end range to improve ROM               PT Short Term Goals - 04/17/18 1249      PT SHORT TERM GOAL #1   Title  STG=LTG        PT Long Term Goals - 04/17/18 1249      PT LONG TERM GOAL #1   Title  Patient will be independent with HEP    Time  6    Period  Weeks    Status  New      PT LONG TERM GOAL #2   Title  Patient will demonstrate 5/5 left knee MMT to improve stability during functional tasks.     Time  6    Period  Weeks    Status  New      PT LONG TERM GOAL #3   Title  Patient will negotiate steps with reciprocating gait pattern and one railing safely access various levels of her home.     Time  6    Period  Weeks    Status  New      PT LONG TERM GOAL #4   Title  Patient will improve 5x sit to stand time to 12 seconds to improve ability to sit to stand and decrease risk of falls.    Time  6    Period  Weeks    Status  New      PT LONG TERM GOAL #5   Title  Patient will demonstrate 115 degrees of active left knee flexion ROM to improve ability to perform functional tasks.     Time  6    Period  Weeks    Status  New      PT LONG TERM GOAL #6   Title  Patient will ambulate with a normalized gait pattern.     Time  6    Period  Weeks    Status  New            Plan - 04/24/18 0748    Clinical Impression Statement  Patient was able to tolerate treatment well despite initial soreness in left knee. Patient and PT reviewed HEP to which patient required verbal cuing for proper hold time for stretches. Patient educated the importance of HEP to optimize PT; patient reported understanding. Patient still limited with PROM 0-90 degrees.    Clinical Presentation  Stable    Clinical Decision Making  Low    Rehab Potential  Good    PT Frequency  2x / week    PT Duration  6 weeks    PT Treatment/Interventions  ADLs/Self Care Home Management;Cryotherapy;Electrical Stimulation;Gait training;Stair training;Neuromuscular re-education;Therapeutic exercise;Therapeutic activities;Functional mobility training;Patient/family education;Manual techniques;Passive range of motion;Vasopneumatic Device;Moist Heat;Balance training    PT  Next Visit Plan  Cont with POC with Nustep, L knee Quad strengthening, steps ups, modalities as needed for pain     Consulted and Agree with Plan of Care  Patient       Patient will benefit from skilled therapeutic intervention in order to improve the following  deficits and impairments:  Abnormal gait, Difficulty walking, Decreased range of motion, Decreased strength, Decreased endurance  Visit Diagnosis: Muscle weakness (generalized)  Stiffness of left knee, not elsewhere classified  Difficulty in walking, not elsewhere classified     Problem List Patient Active Problem List   Diagnosis Date Noted  . Unilateral primary osteoarthritis, left knee 04/04/2017  . Status post total knee replacement, left 04/04/2017  . Acquired hypothyroidism 07/24/2014  . Carotid artery stenosis 09/12/2012  . Pounding in head 04/23/2012  . Hypersomnolence 04/23/2012  . Syncope and collapse 12/27/2011  . Essential hypertension 12/27/2011   Gabriela Eves, PT, DPT 04/24/2018, 8:55 AM  [facility] [address] [place], [place], [address] Phone: [phone]   Fax:  [phone]  Name: Claudia Rocha MRN: [id number] Date of Birth: 23-Feb-1941

## 2018-05-03 ENCOUNTER — Encounter (INDEPENDENT_AMBULATORY_CARE_PROVIDER_SITE_OTHER): Payer: Self-pay | Admitting: Physician Assistant

## 2018-05-03 ENCOUNTER — Ambulatory Visit (INDEPENDENT_AMBULATORY_CARE_PROVIDER_SITE_OTHER): Payer: Medicare HMO | Admitting: Physician Assistant

## 2018-05-03 DIAGNOSIS — M1711 Unilateral primary osteoarthritis, right knee: Secondary | ICD-10-CM | POA: Diagnosis not present

## 2018-05-03 MED ORDER — LIDOCAINE HCL 1 % IJ SOLN
3.0000 mL | INTRAMUSCULAR | Status: AC | PRN
  Administered 2018-05-03: 3 mL

## 2018-05-03 MED ORDER — METHYLPREDNISOLONE ACETATE 40 MG/ML IJ SUSP
40.0000 mg | INTRAMUSCULAR | Status: AC | PRN
  Administered 2018-05-03: 40 mg via INTRA_ARTICULAR

## 2018-05-03 NOTE — Progress Notes (Signed)
Office Visit Note   Patient: Claudia Rocha           Date of Birth: 01/02/1941           MRN: [id number] Visit Date: 05/03/2018              Requested by: Vivien Presto, MD [phone] [address] [place], [place] [address] PCP: Vivien Presto, MD   Assessment & Plan: Visit Diagnoses:  1. Unilateral primary osteoarthritis, right knee     Plan: She will work on range of motion strengthening both knees with her personal trainer also work on gait balance.  She understands that she can have cortisone injections in the right knee no more often than every 3 months.  She is to be mindful of her glucose levels over the next day or so and is aware that the cortisone injection may cause rise in her glucose.   Follow-Up Instructions: Return if symptoms worsen or fail to improve.   Orders:  Orders Placed This Encounter  Procedures  . Large Joint Inj   No orders of the defined types were placed in this encounter.     Procedures: Large Joint Inj: R knee on 05/03/2018 5:22 PM Indications: pain Details: 22 G 1.5 in needle, anterolateral approach  Arthrogram: No  Medications: 3 mL lidocaine 1 %; 40 mg methylPREDNISolone acetate 40 MG/ML Outcome: tolerated well, no immediate complications Procedure, treatment alternatives, risks and benefits explained, specific risks discussed. Consent was given by the patient. Immediately prior to procedure a time out was called to verify the correct patient, procedure, equipment, support staff and site/side marked as required. Patient was prepped and draped in the usual sterile fashion.       Clinical Data: No additional findings.   Subjective: Chief Complaint  Patient presents with  . Right Knee - Pain    HPI Claudia Rocha returns today complaining of right knee pain.  She states that the right knee really became more aggravated with physical therapy they had a more riding a bike then working on gait and balance.  She is requesting a  cortisone injection in her right knee she has known medial compartmental arthritis of the right knee.  She is had no known injury.  Patient is diabetic hemoglobin A1c is reported as 5.8. Review of Systems Please see HPI otherwise negative  Objective: Vital Signs: There were no vitals taken for this visit.  Physical Exam General: Well-developed well-nourished female in no acute distress mood and affect appropriate Ortho Exam Right knee full extension flexion beyond 105 degrees.  She has tenderness along medial joint line.  No instability valgus varus stressing no abnormal warmth erythema. Specialty Comments:  No specialty comments available.  Imaging: No results found.   PMFS History: Patient Active Problem List   Diagnosis Date Noted  . Unilateral primary osteoarthritis, left knee 04/04/2017  . Status post total knee replacement, left 04/04/2017  . Acquired hypothyroidism 07/24/2014  . Carotid artery stenosis 09/12/2012  . Pounding in head 04/23/2012  . Hypersomnolence 04/23/2012  . Syncope and collapse 12/27/2011  . Essential hypertension 12/27/2011   Past Medical History:  Diagnosis Date  . Arthritis    spine  . Diabetes mellitus without complication ([facility])    type II  . Heart murmur    patient denies  . Hypertension    new onset   . Hypothyroidism   . Pulmonary embolism ([facility])    2016, was on eliquis immediately after, no  longer on this  . Syncope     History reviewed. No pertinent family history.  Past Surgical History:  Procedure Laterality Date  . ABDOMINAL HYSTERECTOMY    . CATARACT EXTRACTION    . TONSILLECTOMY    . TOTAL KNEE ARTHROPLASTY Left 04/04/2017   Procedure: LEFT TOTAL KNEE ARTHROPLASTY;  Surgeon: Kathryne HitchBlackman, Christopher Y, MD;  Location: [facility];  Service: Orthopedics;  Laterality: Left;  . TUBAL LIGATION     Social History   Occupational History  . Not on file  Tobacco Use  . Smoking status: Never Smoker  . Smokeless tobacco: Never Used    Substance and Sexual Activity  . Alcohol use: No  . Drug use: No  . Sexual activity: Not Currently    Birth control/protection: Post-menopausal

## 2018-10-08 ENCOUNTER — Ambulatory Visit (INDEPENDENT_AMBULATORY_CARE_PROVIDER_SITE_OTHER): Payer: Medicare HMO | Admitting: Physician Assistant

## 2019-01-31 ENCOUNTER — Other Ambulatory Visit: Payer: Self-pay

## 2019-01-31 ENCOUNTER — Ambulatory Visit (INDEPENDENT_AMBULATORY_CARE_PROVIDER_SITE_OTHER): Payer: Medicare HMO | Admitting: Physician Assistant

## 2019-01-31 ENCOUNTER — Encounter: Payer: Self-pay | Admitting: Physician Assistant

## 2019-01-31 DIAGNOSIS — M1711 Unilateral primary osteoarthritis, right knee: Secondary | ICD-10-CM

## 2019-01-31 MED ORDER — METHYLPREDNISOLONE ACETATE 40 MG/ML IJ SUSP
40.0000 mg | INTRAMUSCULAR | Status: AC | PRN
  Administered 2019-01-31: 40 mg via INTRA_ARTICULAR

## 2019-01-31 MED ORDER — LIDOCAINE HCL 1 % IJ SOLN
3.0000 mL | INTRAMUSCULAR | Status: AC | PRN
  Administered 2019-01-31: 3 mL

## 2019-01-31 NOTE — Progress Notes (Signed)
   Procedure Note  Patient: Claudia Rocha             Date of Birth: 10/02/1940           MRN: [id number]             Visit Date: 01/31/2019 HPI: Ms. Bin returns today complaining of right knee pain.  We we have not seen her since September 2019 when she had a intra-articular injection of the right knee.  She states the injection helped for about 6 months.  She has had no new injury.  Pain is in the medial aspect of the knee.  She has known medial compartment arthritis of the right knee.  Review of systems: No fevers, chills nausea or vomiting.  Right knee: Good range of motion no abnormal warmth erythema or signs of effusion.  She has tenderness along medial joint line no instability valgus varus stressing.  Procedures: Visit Diagnoses: Right knee osteoarthritis  Large Joint Inj: R knee on 01/31/2019 9:22 AM Indications: pain Details: 22 G 1.5 in needle, anterolateral approach  Arthrogram: No  Medications: 3 mL lidocaine 1 %; 40 mg methylPREDNISolone acetate 40 MG/ML Outcome: tolerated well, no immediate complications Procedure, treatment alternatives, risks and benefits explained, specific risks discussed. Consent was given by the patient. Immediately prior to procedure a time out was called to verify the correct patient, procedure, equipment, support staff and site/side marked as required. Patient was prepped and draped in the usual sterile fashion.     Plan: She will follow-up with [place] on in early September to discuss right total knee arthroplasty..  She knows to monitor her glucose levels over the next few days as she is diabetic.

## 2019-10-30 ENCOUNTER — Other Ambulatory Visit: Payer: Self-pay

## 2019-10-30 ENCOUNTER — Encounter: Payer: Self-pay | Admitting: Dermatology

## 2019-10-30 ENCOUNTER — Ambulatory Visit: Payer: Medicare HMO | Admitting: Dermatology

## 2019-10-30 DIAGNOSIS — L814 Other melanin hyperpigmentation: Secondary | ICD-10-CM | POA: Diagnosis not present

## 2019-10-30 NOTE — Patient Instructions (Signed)
Patient returns for reexamination of pigmented spot right zygoma.  She does not feel there has been improvement with previous freezing and would like to do a harder freeze.  Examination including with a derma scope shows no atypical features.  8-second liquid nitrogen freeze performed; she will call me with a status report in 6 weeks.  Encouraged to use daily #30+ SPF sunblock.  Return can be on a as needed basis.

## 2019-10-30 NOTE — Progress Notes (Signed)
     Follow-Up Visit   Subjective  Claudia Rocha is a 79 y.o. female who presents for the following: Skin Problem (check brown spot on face).  Pigmented lesion Location: right cheek Duration: one years Quality:  Associated Signs/Symptoms: Modifying Factors:  Severity:  Timing: Context:   The following portions of the chart were reviewed this encounter and updated as appropriate: Tobacco  Allergies  Meds  Problems  Med Hx  Surg Hx  Fam Hx      Objective  Well appearing patient in no apparent distress; mood and affect are within normal limits.  All skin waist up examined.  Objective  Right Zygomatic Area: IRREGULARLY pigmented 1cm; dermasocope shows lentigo. Image taken. Ln2 freeze @ patient's request. Risk: failure, scar.  Patient returns for reexamination of pigmented spot right zygoma.  She does not feel there has been improvement with previous freezing and would like to do a harder freeze.  Examination including with a derma scope shows no atypical features.  8-second liquid nitrogen freeze performed; she will call me with a status report in 6 weeks.  Encouraged to use daily #30+ SPF sunblock.  Return can be on a as needed basis. Assessment & Plan  Lentigines Right Zygomatic Area  6 second Ln2 freeze, risks of treatment failure/scar detailed. Initial follow up by phone 6 weeks.

## 2019-11-10 NOTE — Addendum Note (Signed)
Addended by: Janalyn Harder on: 11/10/2019 08:58 AM   Modules accepted: Level of Service

## 2019-12-09 ENCOUNTER — Other Ambulatory Visit: Payer: Self-pay

## 2019-12-09 ENCOUNTER — Ambulatory Visit: Payer: Medicare HMO | Admitting: Physician Assistant

## 2019-12-09 ENCOUNTER — Ambulatory Visit (INDEPENDENT_AMBULATORY_CARE_PROVIDER_SITE_OTHER): Payer: Medicare HMO

## 2019-12-09 ENCOUNTER — Encounter: Payer: Self-pay | Admitting: Physician Assistant

## 2019-12-09 DIAGNOSIS — M1711 Unilateral primary osteoarthritis, right knee: Secondary | ICD-10-CM | POA: Diagnosis not present

## 2019-12-09 MED ORDER — METHYLPREDNISOLONE ACETATE 40 MG/ML IJ SUSP
40.0000 mg | INTRAMUSCULAR | Status: AC | PRN
  Administered 2019-12-09: 09:00:00 40 mg via INTRA_ARTICULAR

## 2019-12-09 MED ORDER — LIDOCAINE HCL 1 % IJ SOLN
3.0000 mL | INTRAMUSCULAR | Status: AC | PRN
  Administered 2019-12-09: 09:00:00 3 mL

## 2019-12-09 NOTE — Progress Notes (Signed)
Office Visit Note   Patient: Claudia Rocha           Date of Birth: 05-Sep-1940           MRN: [id number] Visit Date: 12/09/2019              Requested by: Curly Rim, MD [place],  [place] [id number] PCP: Curly Rim, MD   Assessment & Plan: Visit Diagnoses:  1. Unilateral primary osteoarthritis, right knee     Plan: Explained to Claudia Rocha and her husband that if the cortisone injections worked well she can have cortisone injections as often as every 3 months.  May consider supplemental injection.  Otherwise knee replacement however patient is not interested in knee replacement at this point time.  Questions were encouraged and answered  Follow-Up Instructions: No follow-ups on file.   Orders:  Orders Placed This Encounter  Procedures  . Large Joint Inj: R knee  . XR Knee 1-2 Views Right   No orders of the defined types were placed in this encounter.     Procedures: Large Joint Inj: R knee on 12/09/2019 8:39 AM Indications: pain Details: 22 G 1.5 in needle, anterolateral approach  Arthrogram: No  Medications: 3 mL lidocaine 1 %; 40 mg methylPREDNISolone acetate 40 MG/ML Outcome: tolerated well, no immediate complications Procedure, treatment alternatives, risks and benefits explained, specific risks discussed. Consent was given by the patient. Immediately prior to procedure a time out was called to verify the correct patient, procedure, equipment, support staff and site/side marked as required. Patient was prepped and draped in the usual sterile fashion.       Clinical Data: No additional findings.   Subjective: Chief Complaint  Patient presents with  . Right Knee - Pain    HPI  Claudia Rocha comes in today requesting injection in her right knee.  Last injection cortisone was 01/31/2019.  States she did well until recently.  She presents with her husband today who states that she is having trouble getting around.  She states that  the knee just recently began bothering her again.  She does admit to having some trouble getting out of the chair and going up and down steps.  She has had prior left total knee arthroplasty and is doing well.  She is diabetic hemoglobin A1c 6.2.  No new injury to the right knee.  Denies any swelling of the right knee.  Review of Systems  Constitutional: Negative for chills and fever.  Respiratory: Negative for shortness of breath.   Musculoskeletal: Positive for arthralgias.  See HPI   Objective: Vital Signs: There were no vitals taken for this visit.  Physical Exam Constitutional:      Appearance: She is not ill-appearing or diaphoretic.  Pulmonary:     Effort: Pulmonary effort is normal.  Neurological:     Mental Status: She is alert and oriented to person, place, and time.  Psychiatric:        Mood and Affect: Mood normal.     Ortho Exam Right knee no abnormal warmth erythema or effusion.  Good range of motion without significant pain.  No instability valgus varus stressing.  Left knee good range of motion without pain.  Surgical incisions well-healed. Specialty Comments:  No specialty comments available.  Imaging: XR Knee 1-2 Views Right  Result Date: 12/09/2019 Right knee 2 views: No acute fractures.  Tricompartmental arthritic changes with moderate medial compartmental narrowing with severe patellofemoral changes.  Right knee is well located.  Status post left total knee arthroplasty with well-seated components.    PMFS History: Patient Active Problem List   Diagnosis Date Noted  . Unilateral primary osteoarthritis, left knee 04/04/2017  . Status post total knee replacement, left 04/04/2017  . Acquired hypothyroidism 07/24/2014  . Carotid artery stenosis 09/12/2012  . Pounding in head 04/23/2012  . Hypersomnolence 04/23/2012  . Syncope and collapse 12/27/2011  . Essential hypertension 12/27/2011   Past Medical History:  Diagnosis Date  . Arthritis    spine    . Diabetes mellitus without complication ([facility])    type II  . Heart murmur    patient denies  . Hypertension    new onset   . Hypothyroidism   . Pulmonary embolism ([facility])    2016, was on eliquis immediately after, no longer on this  . Syncope     History reviewed. No pertinent family history.  Past Surgical History:  Procedure Laterality Date  . ABDOMINAL HYSTERECTOMY    . CATARACT EXTRACTION    . TONSILLECTOMY    . TOTAL KNEE ARTHROPLASTY Left 04/04/2017   Procedure: LEFT TOTAL KNEE ARTHROPLASTY;  Surgeon: Kathryne Hitch, MD;  Location: [facility];  Service: Orthopedics;  Laterality: Left;  . TUBAL LIGATION     Social History   Occupational History  . Not on file  Tobacco Use  . Smoking status: Never Smoker  . Smokeless tobacco: Never Used  Substance and Sexual Activity  . Alcohol use: No  . Drug use: No  . Sexual activity: Not Currently    Birth control/protection: Post-menopausal

## 2021-10-04 ENCOUNTER — Ambulatory Visit: Payer: Medicare HMO | Admitting: Orthopaedic Surgery

## 2021-10-12 ENCOUNTER — Encounter: Payer: Self-pay | Admitting: Orthopaedic Surgery

## 2021-10-12 ENCOUNTER — Ambulatory Visit: Payer: Medicare HMO | Admitting: Orthopaedic Surgery

## 2021-10-12 DIAGNOSIS — M1711 Unilateral primary osteoarthritis, right knee: Secondary | ICD-10-CM | POA: Diagnosis not present

## 2021-10-12 MED ORDER — LIDOCAINE HCL 1 % IJ SOLN
3.0000 mL | INTRAMUSCULAR | Status: AC | PRN
  Administered 2021-10-12: 3 mL

## 2021-10-12 MED ORDER — METHYLPREDNISOLONE ACETATE 40 MG/ML IJ SUSP
40.0000 mg | INTRAMUSCULAR | Status: AC | PRN
  Administered 2021-10-12: 40 mg via INTRA_ARTICULAR

## 2021-10-12 NOTE — Progress Notes (Signed)
° °  Procedure Note  Patient: Claudia Rocha             Date of Birth: Jul 18, 1941           MRN: [id number]             Visit Date: 10/12/2021  HPI: Ms. Eccleston returns today requesting cortisone injection right knee.  We last saw her 12/09/2019.  She states she has done well with the last cortisone injection until just recently.  No new injuries.  She is diabetic last hemoglobin A1c 1 month ago was 6.4.  She denies any fevers or chills.  She has known osteoarthritis of her right knee.  She is not interested in knee replacement.  She has had prior knee replacement on the left which is doing well.  Physical exam: General well-developed well-nourished female no acute distress ambulates with slow slightly antalgic gait but no assistive device. Right knee: No abnormal warmth erythema.  Full range of motion with patellofemoral crepitus.  No instability valgus varus stressing.  Left knee good range of motion without pain.  Surgical incisions well-healed.  Procedures: Visit Diagnoses:  1. Unilateral primary osteoarthritis, right knee     Large Joint Inj on 10/12/2021 8:48 AM Indications: pain Details: 22 G 1.5 in needle, anterolateral approach  Arthrogram: No  Medications: 3 mL lidocaine 1 %; 40 mg methylPREDNISolone acetate 40 MG/ML Outcome: tolerated well, no immediate complications Procedure, treatment alternatives, risks and benefits explained, specific risks discussed. Consent was given by the patient. Immediately prior to procedure a time out was called to verify the correct patient, procedure, equipment, support staff and site/side marked as required. Patient was prepped and draped in the usual sterile fashion.     Plan: She understands to monitor her glucose levels closely after receiving the cortisone injection today.  In regards to cortisone injection she can have injections as often as every 3 months.  She was given a handout on supplemental injection.  If she decides to go with  supplemental injections she may still take 2 to 3 weeks for this to come in.  Questions were encouraged and answered at length.

## 2022-06-13 ENCOUNTER — Ambulatory Visit: Payer: Medicare HMO

## 2022-06-16 ENCOUNTER — Other Ambulatory Visit: Payer: Self-pay

## 2022-06-16 ENCOUNTER — Ambulatory Visit: Payer: Medicare HMO | Attending: Family Medicine

## 2022-06-16 DIAGNOSIS — M6281 Muscle weakness (generalized): Secondary | ICD-10-CM | POA: Insufficient documentation

## 2022-06-16 DIAGNOSIS — R2689 Other abnormalities of gait and mobility: Secondary | ICD-10-CM | POA: Insufficient documentation

## 2022-06-16 NOTE — Therapy (Signed)
OUTPATIENT PHYSICAL THERAPY LOWER EXTREMITY EVALUATION   Patient Name: Claudia Rocha MRN: [id number] DOB:03/01/1941, 81 y.o., female Today's Date: 06/16/2022   PT End of Session - 06/16/22 0901     Visit Number 1    Number of Visits 8    Date for PT Re-Evaluation 08/12/22    PT Start Time 0816    PT Stop Time 0857    PT Time Calculation (min) 41 min    Activity Tolerance Patient tolerated treatment well    Behavior During Therapy [facility] for tasks assessed/performed             Past Medical History:  Diagnosis Date   Arthritis    spine   Diabetes mellitus without complication ([place])    type II   Heart murmur    patient denies   Hypertension    new onset    Hypothyroidism    Pulmonary embolism ([place])    2016, was on eliquis immediately after, no longer on this   Syncope    Past Surgical History:  Procedure Laterality Date   ABDOMINAL HYSTERECTOMY     CATARACT EXTRACTION     TONSILLECTOMY     TOTAL KNEE ARTHROPLASTY Left 04/04/2017   Procedure: LEFT TOTAL KNEE ARTHROPLASTY;  Surgeon: Mcarthur Rossetti, MD;  Location: [place];  Service: Orthopedics;  Laterality: Left;   TUBAL LIGATION     Patient Active Problem List   Diagnosis Date Noted   Unilateral primary osteoarthritis, left knee 04/04/2017   Status post total knee replacement, left 04/04/2017   Acquired hypothyroidism 07/24/2014   Carotid artery stenosis 09/12/2012   Pounding in head 04/23/2012   Hypersomnolence 04/23/2012   Syncope and collapse 12/27/2011   Essential hypertension 12/27/2011   REFERRING PROVIDER: Corrington, Delsa Grana, MD   REFERRING DIAG: Unspecified abnormalities of gait and mobility   THERAPY DIAG:  Other abnormalities of gait and mobility  Muscle weakness (generalized)  Rationale for Evaluation and Treatment: Rehabilitation  ONSET DATE: about 3 years ago  SUBJECTIVE:   SUBJECTIVE STATEMENT: Patient reports that she has been having problems with her right knee after having a  left knee replacement about 3 years ago. Her daughter notes that she has been some SOB recently, but so far all of her cardiac test have been normal.   PERTINENT HISTORY: HTN, OA, and DM PAIN:  Are you having pain? Yes: NPRS scale: 3/10 Pain location: right knee Pain description: sore and aching Aggravating factors: steps, walking  Relieving factors: rest  PRECAUTIONS: None  WEIGHT BEARING RESTRICTIONS: No  FALLS:  Has patient fallen in last 6 months? No  LIVING ENVIRONMENT: Lives with: lives with their family Lives in: House/apartment Stairs: Yes: Internal: she does not have to go up stairs as they had an elevator put in  steps; none Has following equipment at home: None  OCCUPATION: retired  PLOF: Independent  PATIENT GOALS: improved ease with transfers and stairs   OBJECTIVE:   COGNITION: Overall cognitive status: Within functional limits for tasks assessed     SENSATION: Daughter reports that she has mild neuropathy, but patient reports no numbness or tingling.   LOWER EXTREMITY ROM: WFL for activities assessed   LOWER EXTREMITY MMT:  MMT Right eval Left eval  Hip flexion 4/5 4/5  Hip extension    Hip abduction    Hip adduction    Hip internal rotation    Hip external rotation    Knee flexion 4-/5; familiar knee pain 4+/5  Knee extension  4/5 4+/5  Ankle dorsiflexion 4/5 4/5  Ankle plantarflexion    Ankle inversion    Ankle eversion     (Blank rows = not tested)  FUNCTIONAL TESTS:  5 times sit to stand: 28.02 seconds without UE support Timed up and go (TUG): 18.12 seconds Gait speed: 6.19 seconds to walk 18 feet or 0.88 m/s Balance: Romberg: 30 seconds (EO and EC)  Tandem: RLE leading: 5 seconds LLE leading: 30 seconds  GAIT: Assistive device utilized: None Level of assistance: SBA Comments: wide BOS, shuffling pattern   TODAY'S TREATMENT:                                                                                                                               DATE:                                   11/2  EXERCISE LOG  Exercise Repetitions and Resistance Comments  Standing heel and toe raises 15 reps   Standing hip ABD 10 reps  Moderate cueing to prevent hip flexion  Seated heel slide  10 reps             Blank cell = exercise not performed today   PATIENT EDUCATION:  Education details: POC, prognosis, goals of therapy Person educated: Patient, Spouse, and Child(ren) Education method: Explanation Education comprehension: verbalized understanding  HOME EXERCISE PROGRAM: [email or web address]  ASSESSMENT:  CLINICAL IMPRESSION: Patient is a 81 y.o. female who was seen today for physical therapy evaluation and treatment for gait instability and weakness. She is at an elevated risk of falling as evidenced by her lower extremity strength, gait speed, TUG and five time sit to stand assessments. Recommend that she continue with skilled physical therapy to address her remaining impairments to maximize her safety and functional mobility.   OBJECTIVE IMPAIRMENTS: Abnormal gait, decreased balance, decreased mobility, difficulty walking, decreased strength, and pain.   ACTIVITY LIMITATIONS: standing, stairs, transfers, and locomotion level  PARTICIPATION LIMITATIONS: meal prep, cleaning, and yard work  PERSONAL FACTORS: Time since onset of injury/illness/exacerbation and 3+ comorbidities: HTN, OA, and DM  are also affecting patient's functional outcome.   REHAB POTENTIAL: Fair    CLINICAL DECISION MAKING: Stable/uncomplicated  EVALUATION COMPLEXITY: Low   GOALS: Goals reviewed with patient? Yes  LONG TERM GOALS: Target date: 07/14/2022   Patient will be independent with her HEP.  Baseline:  Goal status: INITIAL  2.  Patient will be able to improve her TUG time to 15 seconds or less for improved safety.  Baseline:  Goal status: INITIAL  3.  Patient will be able to improve her five time sit to stand to 20 seconds or less  for improved lower extremity power. Baseline:  Goal status: INITIAL  4.  Patient will be able to improve her gait speed to at least 1.0 m/s for improved functional mobility.  Baseline:  Goal status: INITIAL  PLAN:  PT FREQUENCY: 2x/week  PT DURATION: 4 weeks  PLANNED INTERVENTIONS: Therapeutic exercises, Therapeutic activity, Neuromuscular re-education, Balance training, Gait training, Patient/Family education, Self Care, Joint mobilization, Stair training, Manual therapy, and Re-evaluation  PLAN FOR NEXT SESSION: nustep, lower extremity strengthening, balance interventions, and gait training    Darlin Coco, PT 06/16/2022, 4:07 PM

## 2022-06-21 ENCOUNTER — Ambulatory Visit: Payer: Medicare HMO

## 2022-06-21 DIAGNOSIS — R2689 Other abnormalities of gait and mobility: Secondary | ICD-10-CM

## 2022-06-21 DIAGNOSIS — M6281 Muscle weakness (generalized): Secondary | ICD-10-CM

## 2022-06-21 NOTE — Therapy (Signed)
OUTPATIENT PHYSICAL THERAPY LOWER EXTREMITY TREATMENT   Patient Name: Claudia Rocha MRN: [id number] DOB:1941-06-08, 81 y.o., female Today's Date: 06/21/2022   PT End of Session - 06/21/22 0826     Visit Number 2    Number of Visits 8    Date for PT Re-Evaluation 08/12/22    PT Start Time 0825   Patient arrived late to her appointment.   PT Stop Time 0902    PT Time Calculation (min) 37 min    Activity Tolerance Patient tolerated treatment well    Behavior During Therapy WFL for tasks assessed/performed              Past Medical History:  Diagnosis Date   Arthritis    spine   Diabetes mellitus without complication ([facility])    type II   Heart murmur    patient denies   Hypertension    new onset    Hypothyroidism    Pulmonary embolism ([facility])    2016, was on eliquis immediately after, no longer on this   Syncope    Past Surgical History:  Procedure Laterality Date   ABDOMINAL HYSTERECTOMY     CATARACT EXTRACTION     TONSILLECTOMY     TOTAL KNEE ARTHROPLASTY Left 04/04/2017   Procedure: LEFT TOTAL KNEE ARTHROPLASTY;  Surgeon: Kathryne Hitch, MD;  Location: [facility];  Service: Orthopedics;  Laterality: Left;   TUBAL LIGATION     Patient Active Problem List   Diagnosis Date Noted   Unilateral primary osteoarthritis, left knee 04/04/2017   Status post total knee replacement, left 04/04/2017   Acquired hypothyroidism 07/24/2014   Carotid artery stenosis 09/12/2012   Pounding in head 04/23/2012   Hypersomnolence 04/23/2012   Syncope and collapse 12/27/2011   Essential hypertension 12/27/2011   REFERRING PROVIDER: Corrington, Kip A, MD   REFERRING DIAG: Unspecified abnormalities of gait and mobility   THERAPY DIAG:  Other abnormalities of gait and mobility  Muscle weakness (generalized)  Rationale for Evaluation and Treatment: Rehabilitation  ONSET DATE: about 3 years ago  SUBJECTIVE:   SUBJECTIVE STATEMENT: Patient reports that she feels alright  today. She notes that she has not been doing her HEP.   PERTINENT HISTORY: HTN, OA, and DM PAIN:  Are you having pain? Yes: NPRS scale: 3/10 Pain location: right knee Pain description: sore and aching Aggravating factors: steps, walking  Relieving factors: rest  PRECAUTIONS: None  WEIGHT BEARING RESTRICTIONS: No  FALLS:  Has patient fallen in last 6 months? No  LIVING ENVIRONMENT: Lives with: lives with their family Lives in: House/apartment Stairs: Yes: Internal: she does not have to go up stairs as they had an elevator put in  steps; none Has following equipment at home: None  OCCUPATION: retired  PLOF: Independent  PATIENT GOALS: improved ease with transfers and stairs   OBJECTIVE: all objective measures were assessed at his initial evaluation on 06/16/22 unless otherwise noted  COGNITION: Overall cognitive status: Impaired     SENSATION: Daughter reports that she has mild neuropathy, but patient reports no numbness or tingling.   LOWER EXTREMITY ROM: WFL for activities assessed   LOWER EXTREMITY MMT:  MMT Right eval Left eval  Hip flexion 4/5 4/5  Hip extension    Hip abduction    Hip adduction    Hip internal rotation    Hip external rotation    Knee flexion 4-/5; familiar knee pain 4+/5  Knee extension 4/5 4+/5  Ankle dorsiflexion 4/5 4/5  Ankle plantarflexion  Ankle inversion    Ankle eversion     (Blank rows = not tested)  FUNCTIONAL TESTS:  5 times sit to stand: 28.02 seconds without UE support Timed up and go (TUG): 18.12 seconds Gait speed: 6.19 seconds to walk 18 feet or 0.88 m/s Balance: Romberg: 30 seconds (EO and EC)  Tandem: RLE leading: 5 seconds LLE leading: 30 seconds  GAIT: Assistive device utilized: None Level of assistance: SBA Comments: wide BOS, shuffling pattern   TODAY'S TREATMENT:                                                                                                                              DATE:                                     11/7 EXERCISE LOG  Exercise Repetitions and Resistance Comments  Nustep L3 x 10 minutes   LAQ 30 reps each   Seated hip ADD isometric 2 minutes w/ cues for a 5 second hold   Standing heel raises 30 reps  Required significant cueing to prevent trunk extension  Seated marching  2.5 minutes Required significant cueing for slow large motion  Seated clams  3 minutes    Seated knee flexion Attempted, limited by knee pain   Seated toe raises 2 minutes    Blank cell = exercise not performed today                                   11/2  EXERCISE LOG  Exercise Repetitions and Resistance Comments  Standing heel and toe raises 15 reps   Standing hip ABD 10 reps  Moderate cueing to prevent hip flexion  Seated heel slide  10 reps             Blank cell = exercise not performed today   PATIENT EDUCATION:  Education details: POC, prognosis, goals of therapy Person educated: Patient, Spouse, and Child(ren) Education method: Explanation Education comprehension: verbalized understanding  HOME EXERCISE PROGRAM: [email or web address]  ASSESSMENT:  CLINICAL IMPRESSION: Patient was introduced to multiple new interventions for improved lower extremity strength and stability. She required significant cueing with today's new interventions for proper pacing and exercise performance. She required multiple seated rest breaks throughout treatment due to fatigue and reported right knee pain. She reported feeling tired upon the conclusion of treatment. She continues to require skilled physical therapy to address her remaining impairments to maximize her functional mobility.   OBJECTIVE IMPAIRMENTS: Abnormal gait, decreased balance, decreased mobility, difficulty walking, decreased strength, and pain.   ACTIVITY LIMITATIONS: standing, stairs, transfers, and locomotion level  PARTICIPATION LIMITATIONS: meal prep, cleaning, and yard work  PERSONAL FACTORS: Time since onset of  injury/illness/exacerbation and 3+ comorbidities: HTN, OA, and DM  are also affecting patient's functional outcome.  REHAB POTENTIAL: Fair    CLINICAL DECISION MAKING: Stable/uncomplicated  EVALUATION COMPLEXITY: Low   GOALS: Goals reviewed with patient? Yes  LONG TERM GOALS: Target date: 07/14/2022   Patient will be independent with her HEP.  Baseline:  Goal status: INITIAL  2.  Patient will be able to improve her TUG time to 15 seconds or less for improved safety.  Baseline:  Goal status: INITIAL  3.  Patient will be able to improve her five time sit to stand to 20 seconds or less for improved lower extremity power. Baseline:  Goal status: INITIAL  4.  Patient will be able to improve her gait speed to at least 1.0 m/s for improved functional mobility.  Baseline:  Goal status: INITIAL  PLAN:  PT FREQUENCY: 2x/week  PT DURATION: 4 weeks  PLANNED INTERVENTIONS: Therapeutic exercises, Therapeutic activity, Neuromuscular re-education, Balance training, Gait training, Patient/Family education, Self Care, Joint mobilization, Stair training, Manual therapy, and Re-evaluation  PLAN FOR NEXT SESSION: nustep, lower extremity strengthening, balance interventions, and gait training    Darlin Coco, PT 06/21/2022, 9:10 AM

## 2022-06-23 ENCOUNTER — Ambulatory Visit: Payer: Medicare HMO

## 2022-06-23 DIAGNOSIS — M6281 Muscle weakness (generalized): Secondary | ICD-10-CM

## 2022-06-23 DIAGNOSIS — R2689 Other abnormalities of gait and mobility: Secondary | ICD-10-CM | POA: Diagnosis not present

## 2022-06-23 NOTE — Therapy (Signed)
OUTPATIENT PHYSICAL THERAPY LOWER EXTREMITY TREATMENT   Patient Name: MULAN ADAN MRN: [id number] DOB:Jul 01, 1941, 81 y.o., female Today's Date: 06/23/2022   PT End of Session - 06/23/22 0814     Visit Number 3    Number of Visits 8    Date for PT Re-Evaluation 08/12/22    PT Start Time 0815    PT Stop Time 0858    PT Time Calculation (min) 43 min    Activity Tolerance Patient tolerated treatment well    Behavior During Therapy [facility] for tasks assessed/performed              Past Medical History:  Diagnosis Date   Arthritis    spine   Diabetes mellitus without complication ([facility])    type II   Heart murmur    patient denies   Hypertension    new onset    Hypothyroidism    Pulmonary embolism ([facility])    2016, was on eliquis immediately after, no longer on this   Syncope    Past Surgical History:  Procedure Laterality Date   ABDOMINAL HYSTERECTOMY     CATARACT EXTRACTION     TONSILLECTOMY     TOTAL KNEE ARTHROPLASTY Left 04/04/2017   Procedure: LEFT TOTAL KNEE ARTHROPLASTY;  Surgeon: Kathryne Hitch, MD;  Location: [facility];  Service: Orthopedics;  Laterality: Left;   TUBAL LIGATION     Patient Active Problem List   Diagnosis Date Noted   Unilateral primary osteoarthritis, left knee 04/04/2017   Status post total knee replacement, left 04/04/2017   Acquired hypothyroidism 07/24/2014   Carotid artery stenosis 09/12/2012   Pounding in head 04/23/2012   Hypersomnolence 04/23/2012   Syncope and collapse 12/27/2011   Essential hypertension 12/27/2011   REFERRING PROVIDER: Corrington, Kip A, MD   REFERRING DIAG: Unspecified abnormalities of gait and mobility   THERAPY DIAG:  Other abnormalities of gait and mobility  Muscle weakness (generalized)  Rationale for Evaluation and Treatment: Rehabilitation  ONSET DATE: about 3 years ago  SUBJECTIVE:   SUBJECTIVE STATEMENT: Patient reports that she feels alright today.   PERTINENT HISTORY: HTN, OA, and  DM PAIN:  Are you having pain? Yes: NPRS scale: 3/10 Pain location: right knee Pain description: sore and aching Aggravating factors: steps, walking  Relieving factors: rest  PRECAUTIONS: None  WEIGHT BEARING RESTRICTIONS: No  FALLS:  Has patient fallen in last 6 months? No  LIVING ENVIRONMENT: Lives with: lives with their family Lives in: House/apartment Stairs: Yes: Internal: she does not have to go up stairs as they had an elevator put in  steps; none Has following equipment at home: None  OCCUPATION: retired  PLOF: Independent  PATIENT GOALS: improved ease with transfers and stairs   OBJECTIVE: all objective measures were assessed at his initial evaluation on 06/16/22 unless otherwise noted  COGNITION: Overall cognitive status: Impaired     SENSATION: Daughter reports that she has mild neuropathy, but patient reports no numbness or tingling.   LOWER EXTREMITY ROM: WFL for activities assessed   LOWER EXTREMITY MMT:  MMT Right eval Left eval  Hip flexion 4/5 4/5  Hip extension    Hip abduction    Hip adduction    Hip internal rotation    Hip external rotation    Knee flexion 4-/5; familiar knee pain 4+/5  Knee extension 4/5 4+/5  Ankle dorsiflexion 4/5 4/5  Ankle plantarflexion    Ankle inversion    Ankle eversion     (Blank rows =  not tested)  FUNCTIONAL TESTS:  5 times sit to stand: 28.02 seconds without UE support Timed up and go (TUG): 18.12 seconds Gait speed: 6.19 seconds to walk 18 feet or 0.88 m/s Balance: Romberg: 30 seconds (EO and EC)  Tandem: RLE leading: 5 seconds LLE leading: 30 seconds  GAIT: Assistive device utilized: None Level of assistance: SBA Comments: wide BOS, shuffling pattern   TODAY'S TREATMENT:                                                                                                                              DATE:                                    11/9 EXERCISE LOG  Exercise Repetitions and Resistance  Comments  Nustep  L4 x 17 minutes   LAQ 2 minutes   Seated hip ADD isometric 3 minutes w/ 5 second hold    Seated clams  Red t-band x 3 minutes     Seated marching  Red t-band x 2 minutes    Seated heel/toe raises 3 minutes   Seated hip ABD 3 minutes    Blank cell = exercise not performed today                                    11/7 EXERCISE LOG  Exercise Repetitions and Resistance Comments  Nustep L3 x 10 minutes   LAQ 30 reps each   Seated hip ADD isometric 2 minutes w/ cues for a 5 second hold   Standing heel raises 30 reps  Required significant cueing to prevent trunk extension  Seated marching  2.5 minutes Required significant cueing for slow large motion  Seated clams  3 minutes    Seated knee flexion Attempted, limited by knee pain   Seated toe raises 2 minutes    Blank cell = exercise not performed today                                   11/2  EXERCISE LOG  Exercise Repetitions and Resistance Comments  Standing heel and toe raises 15 reps   Standing hip ABD 10 reps  Moderate cueing to prevent hip flexion  Seated heel slide  10 reps             Blank cell = exercise not performed today   PATIENT EDUCATION:  Education details: POC, prognosis, goals of therapy Person educated: Patient, Spouse, and Child(ren) Education method: Explanation Education comprehension: verbalized understanding  HOME EXERCISE PROGRAM: [email or web address]  ASSESSMENT:  CLINICAL IMPRESSION: Treatment focused on familiar interventions for improved lower extremity strength. She required significant cueing with today's interventions for proper exercise performance and to maximize her ROM as  she would tend to perform small quick movements with activities such as long arc quads. She reported no pain with any of today's interventions. However, she noted that she was tired at the conclusion of treatment. Recommend that she continue with skilled physical therapy to address her impairments to maximize her safety  and functional mobility.   OBJECTIVE IMPAIRMENTS: Abnormal gait, decreased balance, decreased mobility, difficulty walking, decreased strength, and pain.   ACTIVITY LIMITATIONS: standing, stairs, transfers, and locomotion level  PARTICIPATION LIMITATIONS: meal prep, cleaning, and yard work  PERSONAL FACTORS: Time since onset of injury/illness/exacerbation and 3+ comorbidities: HTN, OA, and DM  are also affecting patient's functional outcome.   REHAB POTENTIAL: Fair    CLINICAL DECISION MAKING: Stable/uncomplicated  EVALUATION COMPLEXITY: Low   GOALS: Goals reviewed with patient? Yes  LONG TERM GOALS: Target date: 07/14/2022   Patient will be independent with her HEP.  Baseline:  Goal status: INITIAL  2.  Patient will be able to improve her TUG time to 15 seconds or less for improved safety.  Baseline:  Goal status: INITIAL  3.  Patient will be able to improve her five time sit to stand to 20 seconds or less for improved lower extremity power. Baseline:  Goal status: INITIAL  4.  Patient will be able to improve her gait speed to at least 1.0 m/s for improved functional mobility.  Baseline:  Goal status: INITIAL  PLAN:  PT FREQUENCY: 2x/week  PT DURATION: 4 weeks  PLANNED INTERVENTIONS: Therapeutic exercises, Therapeutic activity, Neuromuscular re-education, Balance training, Gait training, Patient/Family education, Self Care, Joint mobilization, Stair training, Manual therapy, and Re-evaluation  PLAN FOR NEXT SESSION: nustep, lower extremity strengthening, balance interventions, and gait training    Granville Lewis, PT 06/23/2022, 9:05 AM

## 2022-06-28 ENCOUNTER — Encounter: Payer: Self-pay | Admitting: Physical Therapy

## 2022-06-28 ENCOUNTER — Ambulatory Visit: Payer: Medicare HMO | Admitting: Physical Therapy

## 2022-06-28 DIAGNOSIS — R2689 Other abnormalities of gait and mobility: Secondary | ICD-10-CM | POA: Diagnosis not present

## 2022-06-28 DIAGNOSIS — M6281 Muscle weakness (generalized): Secondary | ICD-10-CM

## 2022-06-28 NOTE — Therapy (Signed)
OUTPATIENT PHYSICAL THERAPY LOWER EXTREMITY TREATMENT   Patient Name: Claudia Rocha MRN: [id number] DOB:September 21, 1940, 81 y.o., female Today's Date: 06/28/2022   PT End of Session - 06/28/22 0858     Visit Number 4    Number of Visits 8    Date for PT Re-Evaluation 08/12/22    PT Start Time 0833    PT Stop Time 0902    PT Time Calculation (min) 29 min    Activity Tolerance Patient tolerated treatment well    Behavior During Therapy [facility] for tasks assessed/performed              Past Medical History:  Diagnosis Date   Arthritis    spine   Diabetes mellitus without complication (HCC)    type II   Heart murmur    patient denies   Hypertension    new onset    Hypothyroidism    Pulmonary embolism (HCC)    2016, was on eliquis immediately after, no longer on this   Syncope    Past Surgical History:  Procedure Laterality Date   ABDOMINAL HYSTERECTOMY     CATARACT EXTRACTION     TONSILLECTOMY     TOTAL KNEE ARTHROPLASTY Left 04/04/2017   Procedure: LEFT TOTAL KNEE ARTHROPLASTY;  Surgeon: Kathryne Hitch, MD;  Location: [facility] OR;  Service: Orthopedics;  Laterality: Left;   TUBAL LIGATION     Patient Active Problem List   Diagnosis Date Noted   Unilateral primary osteoarthritis, left knee 04/04/2017   Status post total knee replacement, left 04/04/2017   Acquired hypothyroidism 07/24/2014   Carotid artery stenosis 09/12/2012   Pounding in head 04/23/2012   Hypersomnolence 04/23/2012   Syncope and collapse 12/27/2011   Essential hypertension 12/27/2011   REFERRING PROVIDER: Corrington, Kip A, MD   REFERRING DIAG: Unspecified abnormalities of gait and mobility   THERAPY DIAG:  Other abnormalities of gait and mobility  Muscle weakness (generalized)  Rationale for Evaluation and Treatment: Rehabilitation  ONSET DATE: about 3 years ago  SUBJECTIVE:   SUBJECTIVE STATEMENT: Patient reports that she feels alright today.   PERTINENT HISTORY: HTN, OA, and  DM PAIN:  Are you having pain? Yes: NPRS scale: 3/10 Pain location: right knee Pain description: sore and aching Aggravating factors: steps, walking  Relieving factors: rest  PRECAUTIONS: None  WEIGHT BEARING RESTRICTIONS: No  FALLS:  Has patient fallen in last 6 months? No  LIVING ENVIRONMENT: Lives with: lives with their family Lives in: House/apartment Stairs: Yes: Internal: she does not have to go up stairs as they had an elevator put in  steps; none Has following equipment at home: None  OCCUPATION: retired  PLOF: Independent  PATIENT GOALS: improved ease with transfers and stairs   OBJECTIVE: all objective measures were assessed at his initial evaluation on 06/16/22 unless otherwise noted  COGNITION: Overall cognitive status: Impaired     SENSATION: Daughter reports that she has mild neuropathy, but patient reports no numbness or tingling.   LOWER EXTREMITY ROM: WFL for activities assessed   LOWER EXTREMITY MMT:  MMT Right eval Left eval  Hip flexion 4/5 4/5  Hip extension    Hip abduction    Hip adduction    Hip internal rotation    Hip external rotation    Knee flexion 4-/5; familiar knee pain 4+/5  Knee extension 4/5 4+/5  Ankle dorsiflexion 4/5 4/5  Ankle plantarflexion    Ankle inversion    Ankle eversion     (Blank rows =  not tested)  FUNCTIONAL TESTS:  5 times sit to stand: 28.02 seconds without UE support Timed up and go (TUG): 18.12 seconds Gait speed: 6.19 seconds to walk 18 feet or 0.88 m/s Balance: Romberg: 30 seconds (EO and EC)  Tandem: RLE leading: 5 seconds LLE leading: 30 seconds  GAIT: Assistive device utilized: None Level of assistance: SBA Comments: wide BOS, shuffling pattern   TODAY'S TREATMENT:                                                                                                                              DATE:                                    06/28/22 EXERCISE LOG  Exercise Repetitions and  Resistance Comments  Nustep  L4 x 15 minutes   Rockerboard 3 minutes   Knee ext 10# 3 minutes.   Ham curls 30# 3 minutes.                 ASSESSMENT:  CLINICAL IMPRESSION: Patient did very well with the addition of weight machines.  She reported no pain with any of today's interventions. She did states she was tired and wanted to conclude treatment a bit early today. Recommend that she continue with skilled physical therapy to address her impairments to maximize her safety and functional mobility.   OBJECTIVE IMPAIRMENTS: Abnormal gait, decreased balance, decreased mobility, difficulty walking, decreased strength, and pain.   ACTIVITY LIMITATIONS: standing, stairs, transfers, and locomotion level  PARTICIPATION LIMITATIONS: meal prep, cleaning, and yard work  PERSONAL FACTORS: Time since onset of injury/illness/exacerbation and 3+ comorbidities: HTN, OA, and DM  are also affecting patient's functional outcome.   REHAB POTENTIAL: Fair    CLINICAL DECISION MAKING: Stable/uncomplicated  EVALUATION COMPLEXITY: Low   GOALS: Goals reviewed with patient? Yes  LONG TERM GOALS: Target date: 07/14/2022   Patient will be independent with her HEP.  Baseline:  Goal status: INITIAL  2.  Patient will be able to improve her TUG time to 15 seconds or less for improved safety.  Baseline:  Goal status: INITIAL  3.  Patient will be able to improve her five time sit to stand to 20 seconds or less for improved lower extremity power. Baseline:  Goal status: INITIAL  4.  Patient will be able to improve her gait speed to at least 1.0 m/s for improved functional mobility.  Baseline:  Goal status: INITIAL  PLAN:  PT FREQUENCY: 2x/week  PT DURATION: 4 weeks  PLANNED INTERVENTIONS: Therapeutic exercises, Therapeutic activity, Neuromuscular re-education, Balance training, Gait training, Patient/Family education, Self Care, Joint mobilization, Stair training, Manual therapy, and  Re-evaluation  PLAN FOR NEXT SESSION: nustep, lower extremity strengthening, balance interventions, and gait training    Cosimo Schertzer, [place], PT 06/28/2022, 9:41 AM

## 2022-06-30 ENCOUNTER — Ambulatory Visit: Payer: Medicare HMO | Admitting: Physical Therapy

## 2022-06-30 ENCOUNTER — Encounter: Payer: Self-pay | Admitting: Physical Therapy

## 2022-06-30 DIAGNOSIS — R2689 Other abnormalities of gait and mobility: Secondary | ICD-10-CM | POA: Diagnosis not present

## 2022-06-30 DIAGNOSIS — M6281 Muscle weakness (generalized): Secondary | ICD-10-CM

## 2022-06-30 NOTE — Therapy (Signed)
OUTPATIENT PHYSICAL THERAPY LOWER EXTREMITY TREATMENT   Patient Name: Claudia Rocha MRN: [id number] DOB:1941/01/13, 81 y.o., female Today's Date: 06/30/2022   PT End of Session - 06/30/22 0821     Visit Number 5    Number of Visits 8    Date for PT Re-Evaluation 08/12/22    PT Start Time 0817    PT Stop Time 0852    PT Time Calculation (min) 35 min    Activity Tolerance Patient limited by pain    Behavior During Therapy [facility] for tasks assessed/performed            Past Medical History:  Diagnosis Date   Arthritis    spine   Diabetes mellitus without complication (HCC)    type II   Heart murmur    patient denies   Hypertension    new onset    Hypothyroidism    Pulmonary embolism (HCC)    2016, was on eliquis immediately after, no longer on this   Syncope    Past Surgical History:  Procedure Laterality Date   ABDOMINAL HYSTERECTOMY     CATARACT EXTRACTION     TONSILLECTOMY     TOTAL KNEE ARTHROPLASTY Left 04/04/2017   Procedure: LEFT TOTAL KNEE ARTHROPLASTY;  Surgeon: Kathryne Hitch, MD;  Location: [facility] OR;  Service: Orthopedics;  Laterality: Left;   TUBAL LIGATION     Patient Active Problem List   Diagnosis Date Noted   Unilateral primary osteoarthritis, left knee 04/04/2017   Status post total knee replacement, left 04/04/2017   Acquired hypothyroidism 07/24/2014   Carotid artery stenosis 09/12/2012   Pounding in head 04/23/2012   Hypersomnolence 04/23/2012   Syncope and collapse 12/27/2011   Essential hypertension 12/27/2011   REFERRING PROVIDER: Corrington, Kip A, MD   REFERRING DIAG: Unspecified abnormalities of gait and mobility   THERAPY DIAG:  Other abnormalities of gait and mobility  Muscle weakness (generalized)  Rationale for Evaluation and Treatment: Rehabilitation  ONSET DATE: about 3 years ago  SUBJECTIVE:   SUBJECTIVE STATEMENT: Having more pain today when she takes a step.  PERTINENT HISTORY: HTN, OA, and DM PAIN:   Are you having pain? Yes: NPRS scale: 9/10 Pain location: right knee Pain description: sore and aching Aggravating factors: steps, walking  Relieving factors: rest  PRECAUTIONS: None  PATIENT GOALS: improved ease with transfers and stairs  OBJECTIVE: all objective measures were assessed at his initial evaluation on 06/16/22 unless otherwise noted  LOWER EXTREMITY MMT:  MMT Right eval Left eval  Hip flexion 4/5 4/5  Hip extension    Hip abduction    Hip adduction    Hip internal rotation    Hip external rotation    Knee flexion 4-/5; familiar knee pain 4+/5  Knee extension 4/5 4+/5  Ankle dorsiflexion 4/5 4/5  Ankle plantarflexion    Ankle inversion    Ankle eversion     (Blank rows = not tested)  TODAY'S TREATMENT:  DATE:                                    06/30/22 EXERCISE LOG  Exercise Repetitions and Resistance Comments  Nustep  L3 x 20 minutes   Heel/toe raises X15 reps   Hip abduction Attempted but stopped due to pain   LAQ 3# x15 reps   Clam Red x20 reps   HS curl Red x20 reps   Ball squeeze X15 reps     ASSESSMENT:  CLINICAL IMPRESSION: Patient presented in clinic with 9/10 R knee pain today especially with weightbearing. Patient appeared uncomfortable throughout treatment due to pain. Standing exercises terminated quickly due to pain. Patient observed with SOB during treatment as well. Session overall ended as patient's high level pain remained and patient observed as very uncomfortable.  OBJECTIVE IMPAIRMENTS: Abnormal gait, decreased balance, decreased mobility, difficulty walking, decreased strength, and pain.   ACTIVITY LIMITATIONS: standing, stairs, transfers, and locomotion level  PARTICIPATION LIMITATIONS: meal prep, cleaning, and yard work  PERSONAL FACTORS: Time since onset of injury/illness/exacerbation and 3+  comorbidities: HTN, OA, and DM  are also affecting patient's functional outcome.   REHAB POTENTIAL: Fair    CLINICAL DECISION MAKING: Stable/uncomplicated  EVALUATION COMPLEXITY: Low   GOALS: Goals reviewed with patient? Yes  LONG TERM GOALS: Target date: 07/14/2022   Patient will be independent with her HEP.  Baseline:  Goal status: INITIAL  2.  Patient will be able to improve her TUG time to 15 seconds or less for improved safety.  Baseline:  Goal status: INITIAL  3.  Patient will be able to improve her five time sit to stand to 20 seconds or less for improved lower extremity power. Baseline:  Goal status: INITIAL  4.  Patient will be able to improve her gait speed to at least 1.0 m/s for improved functional mobility.  Baseline:  Goal status: INITIAL  PLAN:  PT FREQUENCY: 2x/week  PT DURATION: 4 weeks  PLANNED INTERVENTIONS: Therapeutic exercises, Therapeutic activity, Neuromuscular re-education, Balance training, Gait training, Patient/Family education, Self Care, Joint mobilization, Stair training, Manual therapy, and Re-evaluation  PLAN FOR NEXT SESSION: nustep, lower extremity strengthening, balance interventions, and gait training    Marvell Fuller, PTA 06/30/2022, 9:02 AM

## 2022-07-05 ENCOUNTER — Encounter: Payer: Self-pay | Admitting: Physical Therapy

## 2022-07-05 ENCOUNTER — Ambulatory Visit: Payer: Medicare HMO | Admitting: Physical Therapy

## 2022-07-05 DIAGNOSIS — R2689 Other abnormalities of gait and mobility: Secondary | ICD-10-CM | POA: Diagnosis not present

## 2022-07-05 DIAGNOSIS — M6281 Muscle weakness (generalized): Secondary | ICD-10-CM

## 2022-07-05 NOTE — Therapy (Signed)
OUTPATIENT PHYSICAL THERAPY LOWER EXTREMITY TREATMENT   Patient Name: Claudia Rocha MRN: [id number] DOB:1940/12/09, 81 y.o., female Today's Date: 07/05/2022   PT End of Session - 07/05/22 0831     Visit Number 6    Number of Visits 8    Date for PT Re-Evaluation 08/12/22    PT Start Time 0822    PT Stop Time 0848    PT Time Calculation (min) 26 min    Activity Tolerance Patient limited by pain;Patient limited by fatigue    Behavior During Therapy [facility] for tasks assessed/performed            Past Medical History:  Diagnosis Date   Arthritis    spine   Diabetes mellitus without complication (HCC)    type II   Heart murmur    patient denies   Hypertension    new onset    Hypothyroidism    Pulmonary embolism (HCC)    2016, was on eliquis immediately after, no longer on this   Syncope    Past Surgical History:  Procedure Laterality Date   ABDOMINAL HYSTERECTOMY     CATARACT EXTRACTION     TONSILLECTOMY     TOTAL KNEE ARTHROPLASTY Left 04/04/2017   Procedure: LEFT TOTAL KNEE ARTHROPLASTY;  Surgeon: Kathryne Hitch, MD;  Location: [facility] OR;  Service: Orthopedics;  Laterality: Left;   TUBAL LIGATION     Patient Active Problem List   Diagnosis Date Noted   Unilateral primary osteoarthritis, left knee 04/04/2017   Status post total knee replacement, left 04/04/2017   Acquired hypothyroidism 07/24/2014   Carotid artery stenosis 09/12/2012   Pounding in Rocha 04/23/2012   Hypersomnolence 04/23/2012   Syncope and collapse 12/27/2011   Essential hypertension 12/27/2011   REFERRING PROVIDER: Corrington, Kip A, MD   REFERRING DIAG: Unspecified abnormalities of gait and mobility   THERAPY DIAG:  Other abnormalities of gait and mobility  Muscle weakness (generalized)  Rationale for Evaluation and Treatment: Rehabilitation  ONSET DATE: about 3 years ago  SUBJECTIVE:   SUBJECTIVE STATEMENT: Having more pain today. Had more pain last night as  well.  PERTINENT HISTORY: HTN, OA, and DM PAIN:  Are you having pain? Yes: NPRS scale: 9/10 Pain location: right knee Pain description: sore and aching Aggravating factors: steps, walking  Relieving factors: rest  PRECAUTIONS: None  PATIENT GOALS: improved ease with transfers and stairs  OBJECTIVE: all objective measures were assessed at his initial evaluation on 06/16/22 unless otherwise noted  LOWER EXTREMITY MMT:  MMT Right eval Left eval  Hip flexion 4/5 4/5  Hip extension    Hip abduction    Hip adduction    Hip internal rotation    Hip external rotation    Knee flexion 4-/5; familiar knee pain 4+/5  Knee extension 4/5 4+/5  Ankle dorsiflexion 4/5 4/5  Ankle plantarflexion    Ankle inversion    Ankle eversion     (Blank rows = not tested)  TODAY'S TREATMENT:  DATE:                                    07/05/22 EXERCISE LOG  Exercise Repetitions and Resistance Comments  Nustep  L3 x 20 minutes   Heel/toe raises X15 reps seated   LAQ 3# x15 reps   Clam Red x20 reps   Marching Red x20 reps    VITALS FOLLOWING THEREX: 100% O2, 84 bpm  ASSESSMENT:  CLINICAL IMPRESSION: Patient presented in clinic with 9/10 R knee pain today and reported having a lot of pain last night. Patient used only LE for nustep but upon transitioning to plinth table, patient was visibly SOB. Patient states that SOB is mostly with exertion now. Cyanosis notable in distal fingers with cold sensation. Patient very fatigued and SOB after minimal therex session and reported a great amount of generalized exhaustion. O2 monitor finally read and findings listed above. Overall tolerance for therapy is low due to SOB and fatigue.  OBJECTIVE IMPAIRMENTS: Abnormal gait, decreased balance, decreased mobility, difficulty walking, decreased strength, and pain.   ACTIVITY LIMITATIONS:  standing, stairs, transfers, and locomotion level  PARTICIPATION LIMITATIONS: meal prep, cleaning, and yard work  PERSONAL FACTORS: Time since onset of injury/illness/exacerbation and 3+ comorbidities: HTN, OA, and DM  are also affecting patient's functional outcome.   REHAB POTENTIAL: Fair    CLINICAL DECISION MAKING: Stable/uncomplicated  EVALUATION COMPLEXITY: Low   GOALS: Goals reviewed with patient? Yes  LONG TERM GOALS: Target date: 07/14/2022   Patient will be independent with her HEP.  Baseline:  Goal status: INITIAL  2.  Patient will be able to improve her TUG time to 15 seconds or less for improved safety.  Baseline:  Goal status: INITIAL  3.  Patient will be able to improve her five time sit to stand to 20 seconds or less for improved lower extremity power. Baseline:  Goal status: INITIAL  4.  Patient will be able to improve her gait speed to at least 1.0 m/s for improved functional mobility.  Baseline:  Goal status: INITIAL  PLAN:  PT FREQUENCY: 2x/week  PT DURATION: 4 weeks  PLANNED INTERVENTIONS: Therapeutic exercises, Therapeutic activity, Neuromuscular re-education, Balance training, Gait training, Patient/Family education, Self Care, Joint mobilization, Stair training, Manual therapy, and Re-evaluation  PLAN FOR NEXT SESSION: nustep, lower extremity strengthening, balance interventions, and gait training    Marvell Fuller, PTA 07/05/2022, 9:13 AM

## 2022-07-12 ENCOUNTER — Ambulatory Visit: Payer: Medicare HMO

## 2022-07-12 DIAGNOSIS — R2689 Other abnormalities of gait and mobility: Secondary | ICD-10-CM | POA: Diagnosis not present

## 2022-07-12 DIAGNOSIS — M6281 Muscle weakness (generalized): Secondary | ICD-10-CM

## 2022-07-12 NOTE — Therapy (Signed)
OUTPATIENT PHYSICAL THERAPY LOWER EXTREMITY TREATMENT   Patient Name: Claudia Rocha MRN: [id number] DOB:1941-07-29, 81 y.o., female Today's Date: 07/12/2022   PT End of Session - 07/12/22 0813     Visit Number 7    Number of Visits 8    Date for PT Re-Evaluation 08/12/22    PT Start Time 0815    PT Stop Time 0846    PT Time Calculation (min) 31 min    Activity Tolerance Patient limited by pain    Behavior During Therapy [facility] for tasks assessed/performed            Past Medical History:  Diagnosis Date   Arthritis    spine   Diabetes mellitus without complication (HCC)    type II   Heart murmur    patient denies   Hypertension    new onset    Hypothyroidism    Pulmonary embolism (HCC)    2016, was on eliquis immediately after, no longer on this   Syncope    Past Surgical History:  Procedure Laterality Date   ABDOMINAL HYSTERECTOMY     CATARACT EXTRACTION     TONSILLECTOMY     TOTAL KNEE ARTHROPLASTY Left 04/04/2017   Procedure: LEFT TOTAL KNEE ARTHROPLASTY;  Surgeon: Kathryne Hitch, MD;  Location: [facility] OR;  Service: Orthopedics;  Laterality: Left;   TUBAL LIGATION     Patient Active Problem List   Diagnosis Date Noted   Unilateral primary osteoarthritis, left knee 04/04/2017   Status post total knee replacement, left 04/04/2017   Acquired hypothyroidism 07/24/2014   Carotid artery stenosis 09/12/2012   Pounding in head 04/23/2012   Hypersomnolence 04/23/2012   Syncope and collapse 12/27/2011   Essential hypertension 12/27/2011   REFERRING PROVIDER: Corrington, Kip A, MD   REFERRING DIAG: Unspecified abnormalities of gait and mobility   THERAPY DIAG:  Other abnormalities of gait and mobility  Muscle weakness (generalized)  Rationale for Evaluation and Treatment: Rehabilitation  ONSET DATE: about 3 years ago  SUBJECTIVE:   SUBJECTIVE STATEMENT: Patient reports that she feels "ok" today, but is "hurting a little bit."  PERTINENT  HISTORY: HTN, OA, and DM PAIN:  Are you having pain? Yes: NPRS scale: none provided/10 Pain location: right knee Pain description: sore and aching Aggravating factors: steps, walking  Relieving factors: rest  PRECAUTIONS: None  PATIENT GOALS: improved ease with transfers and stairs  OBJECTIVE: all objective measures were assessed at his initial evaluation on 06/16/22 unless otherwise noted  LOWER EXTREMITY MMT:  MMT Right eval Left eval  Hip flexion 4/5 4/5  Hip extension    Hip abduction    Hip adduction    Hip internal rotation    Hip external rotation    Knee flexion 4-/5; familiar knee pain 4+/5  Knee extension 4/5 4+/5  Ankle dorsiflexion 4/5 4/5  Ankle plantarflexion    Ankle inversion    Ankle eversion     (Blank rows = not tested)  TODAY'S TREATMENT:  DATE:                                    11/28 EXERCISE LOG  Exercise Repetitions and Resistance Comments  Nustep L3 x 20 minutes LE only; reported left knee pain with following activities which patient attributed to this activity, but no pain was reported while completing this activity   Seated hip ADD isometric 2.5 minutes w/ 5 second hold    Seated heel/toe raises 1.5 minutes Limited by left knee pain           Blank cell = exercise not performed today                                    07/05/22 EXERCISE LOG  Exercise Repetitions and Resistance Comments  Nustep  L3 x 20 minutes   Heel/toe raises X15 reps seated   LAQ 3# x15 reps   Clam Red x20 reps   Marching Red x20 reps    VITALS FOLLOWING THEREX: 100% O2, 84 bpm  ASSESSMENT:  CLINICAL IMPRESSION: Treatment focused on familiar interventions for improved lower extremity strength.  However, she was significantly limited by her familiar left knee pain. Patient requested to leave early due to left knee pain. She may benefit from  additional medical interventions as she remains significantly limited by her familiar knee pain.   OBJECTIVE IMPAIRMENTS: Abnormal gait, decreased balance, decreased mobility, difficulty walking, decreased strength, and pain.   ACTIVITY LIMITATIONS: standing, stairs, transfers, and locomotion level  PARTICIPATION LIMITATIONS: meal prep, cleaning, and yard work  PERSONAL FACTORS: Time since onset of injury/illness/exacerbation and 3+ comorbidities: HTN, OA, and DM  are also affecting patient's functional outcome.   REHAB POTENTIAL: Fair    CLINICAL DECISION MAKING: Stable/uncomplicated  EVALUATION COMPLEXITY: Low   GOALS: Goals reviewed with patient? Yes  LONG TERM GOALS: Target date: 07/14/2022   Patient will be independent with her HEP.  Baseline:  Goal status: INITIAL  2.  Patient will be able to improve her TUG time to 15 seconds or less for improved safety.  Baseline:  Goal status: INITIAL  3.  Patient will be able to improve her five time sit to stand to 20 seconds or less for improved lower extremity power. Baseline:  Goal status: INITIAL  4.  Patient will be able to improve her gait speed to at least 1.0 m/s for improved functional mobility.  Baseline:  Goal status: INITIAL  PLAN:  PT FREQUENCY: 2x/week  PT DURATION: 4 weeks  PLANNED INTERVENTIONS: Therapeutic exercises, Therapeutic activity, Neuromuscular re-education, Balance training, Gait training, Patient/Family education, Self Care, Joint mobilization, Stair training, Manual therapy, and Re-evaluation  PLAN FOR NEXT SESSION: assess goals and potential d/c with HEP due to high pain severity and irritability   Granville Lewis, PT 07/12/2022, 10:38 AM

## 2022-07-14 ENCOUNTER — Ambulatory Visit: Payer: Medicare HMO

## 2022-07-14 DIAGNOSIS — R2689 Other abnormalities of gait and mobility: Secondary | ICD-10-CM | POA: Diagnosis not present

## 2022-07-14 DIAGNOSIS — M6281 Muscle weakness (generalized): Secondary | ICD-10-CM

## 2022-07-14 NOTE — Therapy (Signed)
OUTPATIENT PHYSICAL THERAPY LOWER EXTREMITY TREATMENT   Patient Name: Claudia Rocha MRN: [id number] DOB:05/14/1941, 81 y.o., female Today's Date: 07/14/2022   PT End of Session - 07/14/22 0842     Visit Number 8    Number of Visits 8    Date for PT Re-Evaluation 08/12/22    PT Start Time [phone]   Patient arrived late to her appointment.   PT Stop Time 0851    PT Time Calculation (min) 27 min    Activity Tolerance Patient limited by pain    Behavior During Therapy [facility] for tasks assessed/performed            Past Medical History:  Diagnosis Date   Arthritis    spine   Diabetes mellitus without complication ([place])    type II   Heart murmur    patient denies   Hypertension    new onset    Hypothyroidism    Pulmonary embolism ([place])    2016, was on eliquis immediately after, no longer on this   Syncope    Past Surgical History:  Procedure Laterality Date   ABDOMINAL HYSTERECTOMY     CATARACT EXTRACTION     TONSILLECTOMY     TOTAL KNEE ARTHROPLASTY Left 04/04/2017   Procedure: LEFT TOTAL KNEE ARTHROPLASTY;  Surgeon: Mcarthur Rossetti, MD;  Location: [facility];  Service: Orthopedics;  Laterality: Left;   TUBAL LIGATION     Patient Active Problem List   Diagnosis Date Noted   Unilateral primary osteoarthritis, left knee 04/04/2017   Status post total knee replacement, left 04/04/2017   Acquired hypothyroidism 07/24/2014   Carotid artery stenosis 09/12/2012   Pounding in head 04/23/2012   Hypersomnolence 04/23/2012   Syncope and collapse 12/27/2011   Essential hypertension 12/27/2011   REFERRING PROVIDER: Corrington, Kip A, MD   REFERRING DIAG: Unspecified abnormalities of gait and mobility   THERAPY DIAG:  Other abnormalities of gait and mobility  Muscle weakness (generalized)  Rationale for Evaluation and Treatment: Rehabilitation  ONSET DATE: about 3 years ago  SUBJECTIVE:   SUBJECTIVE STATEMENT: Patient reports that she feels alright today, but her  knee is bothering her quite a bit today. She feels that she has not gotten any better since starting therapy.   PERTINENT HISTORY: HTN, OA, and DM PAIN:  Are you having pain? Yes: NPRS scale: none provided/10 Pain location: right knee Pain description: sore and aching Aggravating factors: steps, walking  Relieving factors: rest  PRECAUTIONS: None  PATIENT GOALS: improved ease with transfers and stairs  OBJECTIVE: all objective measures were assessed at his initial evaluation on 06/16/22 unless otherwise noted  LOWER EXTREMITY MMT:  MMT Right eval Left eval  Hip flexion 4/5 4/5  Hip extension    Hip abduction    Hip adduction    Hip internal rotation    Hip external rotation    Knee flexion 4-/5; familiar knee pain 4+/5  Knee extension 4/5 4+/5  Ankle dorsiflexion 4/5 4/5  Ankle plantarflexion    Ankle inversion    Ankle eversion     (Blank rows = not tested)  TODAY'S TREATMENT:  DATE:                                    11/30 EXERCISE LOG  Exercise Repetitions and Resistance Comments  Nustep  L3 x 20 minutes    Blank cell = exercise not performed today                                    11/28 EXERCISE LOG  Exercise Repetitions and Resistance Comments  Nustep L3 x 20 minutes LE only; reported left knee pain with following activities which patient attributed to this activity, but no pain was reported while completing this activity   Seated hip ADD isometric 2.5 minutes w/ 5 second hold    Seated heel/toe raises 1.5 minutes Limited by left knee pain           Blank cell = exercise not performed today                                    07/05/22 EXERCISE LOG  Exercise Repetitions and Resistance Comments  Nustep  L3 x 20 minutes   Heel/toe raises X15 reps seated   LAQ 3# x15 reps   Clam Red x20 reps   Marching Red x20 reps    VITALS  FOLLOWING THEREX: 100% O2, 84 bpm  ASSESSMENT:  CLINICAL IMPRESSION: Patient has made good progress with skilled physical therapy as evidenced by her improved objective measures and progress toward her goals. She was able to meet all of her goals for improved gait speed, lower extremity power, and safety. However, she reported that she has not been doing her HEP as recommended. She feels that she has not gotten any better due to her knee pain.Recommend that she contact her referring physician due to her continued high knee pain severity and irritability.    PHYSICAL THERAPY DISCHARGE SUMMARY  Visits from Start of Care: 8  Current functional level related to goals / functional outcomes: Patient was able to meet most of her goals for skilled physical therapy. However, she reports that she has not been performing her HEP as recommended.    Remaining deficits: Independence with her HEP   Education / Equipment: HEP    Patient agrees to discharge. Patient goals were partially met. Patient is being discharged due to maximized rehab potential.    OBJECTIVE IMPAIRMENTS: Abnormal gait, decreased balance, decreased mobility, difficulty walking, decreased strength, and pain.   ACTIVITY LIMITATIONS: standing, stairs, transfers, and locomotion level  PARTICIPATION LIMITATIONS: meal prep, cleaning, and yard work  PERSONAL FACTORS: Time since onset of injury/illness/exacerbation and 3+ comorbidities: HTN, OA, and DM  are also affecting patient's functional outcome.   REHAB POTENTIAL: Fair    CLINICAL DECISION MAKING: Stable/uncomplicated  EVALUATION COMPLEXITY: Low   GOALS: Goals reviewed with patient? Yes  LONG TERM GOALS: Target date: 07/14/2022   Patient will be independent with her HEP.  Baseline: Patient reports that she has not been doing any exercises at home.  Goal status: NOT MET  2.  Patient will be able to improve her TUG time to 15 seconds or less for improved safety.   Baseline: 13.84 seconds w/o assistive device Goal status: MET  3.  Patient will be able to improve her five time sit to  stand to 20 seconds or less for improved lower extremity power. Baseline: 19.28 seconds w/o UE support Goal status: MET  4.  Patient will be able to improve her gait speed to at least 1.0 m/s for improved functional mobility.  Baseline: 1.02 m/s Goal status: MET  PLAN:  PT FREQUENCY: 2x/week  PT DURATION: 4 weeks  PLANNED INTERVENTIONS: Therapeutic exercises, Therapeutic activity, Neuromuscular re-education, Balance training, Gait training, Patient/Family education, Self Care, Joint mobilization, Stair training, Manual therapy, and Re-evaluation  PLAN FOR NEXT SESSION: assess goals and potential d/c with HEP due to high pain severity and irritability   Darlin Coco, PT 07/14/2022, 9:15 AM

## 2022-07-28 ENCOUNTER — Ambulatory Visit (INDEPENDENT_AMBULATORY_CARE_PROVIDER_SITE_OTHER): Payer: Medicare HMO | Admitting: Orthopaedic Surgery

## 2022-07-28 ENCOUNTER — Ambulatory Visit (INDEPENDENT_AMBULATORY_CARE_PROVIDER_SITE_OTHER): Payer: Medicare HMO

## 2022-07-28 ENCOUNTER — Encounter: Payer: Self-pay | Admitting: Orthopaedic Surgery

## 2022-07-28 VITALS — Ht 63.0 in | Wt 180.0 lb

## 2022-07-28 DIAGNOSIS — M25561 Pain in right knee: Secondary | ICD-10-CM

## 2022-07-28 DIAGNOSIS — G8929 Other chronic pain: Secondary | ICD-10-CM

## 2022-07-28 DIAGNOSIS — M1712 Unilateral primary osteoarthritis, left knee: Secondary | ICD-10-CM | POA: Diagnosis not present

## 2022-07-28 MED ORDER — BUPIVACAINE HCL 0.25 % IJ SOLN
4.0000 mL | INTRAMUSCULAR | Status: AC | PRN
  Administered 2022-07-28: 4 mL via INTRA_ARTICULAR

## 2022-07-28 MED ORDER — LIDOCAINE HCL 1 % IJ SOLN
0.5000 mL | INTRAMUSCULAR | Status: AC | PRN
  Administered 2022-07-28: .5 mL

## 2022-07-28 MED ORDER — METHYLPREDNISOLONE ACETATE 40 MG/ML IJ SUSP
40.0000 mg | INTRAMUSCULAR | Status: AC | PRN
  Administered 2022-07-28: 40 mg via INTRA_ARTICULAR

## 2022-07-28 NOTE — Progress Notes (Signed)
Office Visit Note   Patient: Claudia Rocha           Date of Birth: 08/11/1941           MRN: [id number] Visit Date: 07/28/2022              Requested by: Vivien Presto, MD [phone] [address] [place],  [place] [address] PCP: Vivien Presto, MD   Assessment & Plan: Visit Diagnoses:  1. Chronic pain of right knee   2. Unilateral primary osteoarthritis, left knee     Plan: Patient wanted to schedule total knee arthroplasty so she could have follow-up in [place].  Will proceed with intra-articular injection and see how she does with this and follow her up early next year to check her progress.  Her principal problem is been wanting to improve her gait and more gait problems than actual knee pain problems that limit some activities.  Recheck 1-10month.  Follow-Up Instructions: No follow-ups on file.   Orders:  Orders Placed This Encounter  Procedures   XR KNEE 3 VIEW RIGHT   No orders of the defined types were placed in this encounter.     Procedures: Large Joint Inj: R knee on 07/28/2022 7:26 PM Indications: pain and joint swelling Details: 22 G 1.5 in needle, anterolateral approach  Arthrogram: No  Medications: 40 mg methylPREDNISolone acetate 40 MG/ML; 0.5 mL lidocaine 1 %; 4 mL bupivacaine 0.25 % Outcome: tolerated well, no immediate complications Procedure, treatment alternatives, risks and benefits explained, specific risks discussed. Consent was given by the patient. Immediately prior to procedure a time out was called to verify the correct patient, procedure, equipment, support staff and site/side marked as required. Patient was prepped and draped in the usual sterile fashion.       Clinical Data: No additional findings.   Subjective: Chief Complaint  Patient presents with   Right Knee - Pain    HPI 81 year old female here with her husband with right knee osteoarthritis.  Previous left total knee arthroplasty 5 years ago by Dr. Magnus Ivan doing well.   She is having problems with gait and activities with pain in her knee.  She had 1 injection that lasted for at least a month at some point in the past she has used ice decreased activity.  She has not had any locking.  Knee x-rays have shown right knee osteoarthritis.  Past history of PE x 2.  She is on Eliquis.  Additionally she has some hypertension carotid artery stenosis.  Review of Systems positive for PE x 2 in the past currently on Eliquis.  All systems noncontributory HPI.   Objective: Vital Signs: Ht 5\' 3"  (1.6 m)   Wt 180 lb (81.6 kg)   BMI 31.89 kg/m   Physical Exam Constitutional:      Appearance: She is well-developed.  HENT:     Head: Normocephalic.     Right Ear: External ear normal.     Left Ear: External ear normal. There is no impacted cerumen.  Eyes:     Pupils: Pupils are equal, round, and reactive to light.  Neck:     Thyroid: No thyromegaly.     Trachea: No tracheal deviation.  Cardiovascular:     Rate and Rhythm: Normal rate.  Pulmonary:     Effort: Pulmonary effort is normal.  Abdominal:     Palpations: Abdomen is soft.  Musculoskeletal:     Cervical back: No rigidity.  Skin:  General: Skin is warm and dry.  Neurological:     Mental Status: She is alert and oriented to person, place, and time.  Psychiatric:        Behavior: Behavior normal.     Ortho Exam right knee crepitus.  Minimal swelling.  Crepitus knee range of motion she does is almost reach full extension she flexes to 105 degrees.  Distal pulses palpable negative logroll to hips.  Specialty Comments:  No specialty comments available.  Imaging: XR KNEE 3 VIEW RIGHT  Result Date: 07/28/2022 Knee x-rays show moderate knee osteoarthritis medial joint line narrowing bone-on-bone marginal osteophytes subchondral sclerosis. Impression: Right knee moderate osteoarthritis.  Opposite left knee shows well-positioned and fixed total knee arthroplasty.    PMFS History: Patient Active Problem  List   Diagnosis Date Noted   Unilateral primary osteoarthritis, left knee 04/04/2017   Status post total knee replacement, left 04/04/2017   Acquired hypothyroidism 07/24/2014   Carotid artery stenosis 09/12/2012   Pounding in head 04/23/2012   Hypersomnolence 04/23/2012   Syncope and collapse 12/27/2011   Essential hypertension 12/27/2011   Past Medical History:  Diagnosis Date   Arthritis    spine   Diabetes mellitus without complication ([facility])    type II   Heart murmur    patient denies   Hypertension    new onset    Hypothyroidism    Pulmonary embolism ([facility])    2016, was on eliquis immediately after, no longer on this   Syncope     No family history on file.  Past Surgical History:  Procedure Laterality Date   ABDOMINAL HYSTERECTOMY     CATARACT EXTRACTION     TONSILLECTOMY     TOTAL KNEE ARTHROPLASTY Left 04/04/2017   Procedure: LEFT TOTAL KNEE ARTHROPLASTY;  Surgeon: Kathryne Hitch, MD;  Location: [facility];  Service: Orthopedics;  Laterality: Left;   TUBAL LIGATION     Social History   Occupational History   Not on file  Tobacco Use   Smoking status: Never   Smokeless tobacco: Never  Vaping Use   Vaping Use: Never used  Substance and Sexual Activity   Alcohol use: No   Drug use: No   Sexual activity: Not Currently    Birth control/protection: Post-menopausal

## 2022-09-01 ENCOUNTER — Encounter: Payer: Self-pay | Admitting: Orthopaedic Surgery

## 2022-09-01 ENCOUNTER — Ambulatory Visit (INDEPENDENT_AMBULATORY_CARE_PROVIDER_SITE_OTHER): Payer: Medicare HMO | Admitting: Orthopaedic Surgery

## 2022-09-01 VITALS — Ht 63.0 in | Wt 180.0 lb

## 2022-09-01 DIAGNOSIS — M1711 Unilateral primary osteoarthritis, right knee: Secondary | ICD-10-CM | POA: Insufficient documentation

## 2022-09-01 NOTE — Progress Notes (Signed)
Office Visit Note   Patient: Claudia Rocha           Date of Birth: 11/10/1940           MRN: [id number] Visit Date: 09/01/2022              Requested by: Curly Rim, MD [address] [place],  [place] [address] PCP: Curly Rim, MD   Assessment & Plan: Visit Diagnoses:  1. Unilateral primary osteoarthritis, right knee     Plan: Patient like to wait till her symptoms a little bit worse.  As long as she can continue to be mobile I think she can delayed as long as she is able to tolerate the pain in her knee.  Recheck 3 to 6 months.   Follow-Up Instructions: No follow-ups on file.   Orders:  No orders of the defined types were placed in this encounter.  No orders of the defined types were placed in this encounter.     Procedures: No procedures performed   Clinical Data: No additional findings.   Subjective: Chief Complaint  Patient presents with   Right Knee - Pain, Follow-up    HPI 82 year old female returns with ongoing problems with right knee pain.  Previous left total knee arthroplasty.  Previous injection 07/28/2022 with short-term relief.  She states she is hesitant to consider total knee arthroplasty since she is concerned about the amount of work and therapy required to get good result.  She has used anti-inflammatory ' topical cream.  Patient is currently on Eliquis history of PE x 2.  Review of Systems updated unchanged from 07/28/2022.   Objective: Vital Signs: Ht 5\' 3"  (1.6 m)   Wt 180 lb (81.6 kg)   BMI 31.89 kg/m   Physical Exam Constitutional:      Appearance: She is well-developed.  HENT:     Head: Normocephalic.     Right Ear: External ear normal.     Left Ear: External ear normal. There is no impacted cerumen.  Eyes:     Pupils: Pupils are equal, round, and reactive to light.  Neck:     Thyroid: No thyromegaly.     Trachea: No tracheal deviation.  Cardiovascular:     Rate and Rhythm: Normal rate.  Pulmonary:      Effort: Pulmonary effort is normal.  Abdominal:     Palpations: Abdomen is soft.  Musculoskeletal:     Cervical back: No rigidity.  Skin:    General: Skin is warm and dry.  Neurological:     Mental Status: She is alert and oriented to person, place, and time.  Psychiatric:        Behavior: Behavior normal.     Ortho Exam well-healed midline incision left knee good range of motion no swelling.  Right knee has crepitus with range of motion she lacks about 5 degrees full extension flexes 105 degrees.  Distal pulses intact negative logroll hips.  Specialty Comments:  No specialty comments available.  Imaging: No results found.   PMFS History: Patient Active Problem List   Diagnosis Date Noted   Unilateral primary osteoarthritis, right knee 09/01/2022   Status post total knee replacement, left 04/04/2017   Acquired hypothyroidism 07/24/2014   Carotid artery stenosis 09/12/2012   Pounding in head 04/23/2012   Hypersomnolence 04/23/2012   Syncope and collapse 12/27/2011   Essential hypertension 12/27/2011   Past Medical History:  Diagnosis Date   Arthritis    spine  Diabetes mellitus without complication ([place])    type II   Heart murmur    patient denies   Hypertension    new onset    Hypothyroidism    Pulmonary embolism ([place])    2016, was on eliquis immediately after, no longer on this   Syncope     No family history on file.  Past Surgical History:  Procedure Laterality Date   ABDOMINAL HYSTERECTOMY     CATARACT EXTRACTION     TONSILLECTOMY     TOTAL KNEE ARTHROPLASTY Left 04/04/2017   Procedure: LEFT TOTAL KNEE ARTHROPLASTY;  Surgeon: Mcarthur Rossetti, MD;  Location: [facility];  Service: Orthopedics;  Laterality: Left;   TUBAL LIGATION     Social History   Occupational History   Not on file  Tobacco Use   Smoking status: Never   Smokeless tobacco: Never  Vaping Use   Vaping Use: Never used  Substance and Sexual Activity   Alcohol use: No   Drug  use: No   Sexual activity: Not Currently    Birth control/protection: Post-menopausal

## 2022-11-03 ENCOUNTER — Encounter: Payer: Self-pay | Admitting: Orthopaedic Surgery

## 2022-11-03 ENCOUNTER — Ambulatory Visit (INDEPENDENT_AMBULATORY_CARE_PROVIDER_SITE_OTHER): Payer: Medicare HMO | Admitting: Orthopaedic Surgery

## 2022-11-03 VITALS — Ht 63.0 in | Wt 180.0 lb

## 2022-11-03 DIAGNOSIS — M1711 Unilateral primary osteoarthritis, right knee: Secondary | ICD-10-CM

## 2022-11-03 DIAGNOSIS — Z96652 Presence of left artificial knee joint: Secondary | ICD-10-CM | POA: Diagnosis not present

## 2022-11-03 NOTE — Progress Notes (Signed)
Office Visit Note   Patient: Claudia Rocha           Date of Birth: 07-14-1941           MRN: [id number] Visit Date: 11/03/2022              Requested by: Curly Rim, MD [place],  [place] [address] PCP: Curly Rim, MD   Assessment & Plan: Visit Diagnoses:  1. Unilateral primary osteoarthritis, right knee   2. Status post total knee replacement, left     Plan: Patient can return when she has increased symptoms of right knee wants to consider discussion of total knee arthroplasty.  Follow-Up Instructions: No follow-ups on file.   Orders:  No orders of the defined types were placed in this encounter.  No orders of the defined types were placed in this encounter.     Procedures: No procedures performed   Clinical Data: No additional findings.   Subjective: Chief Complaint  Patient presents with   Right Knee - Pain, Follow-up    HPI 82 year old female returns she had left total knee arthroplasty 2018.  She has right knee arthritis injection did not help she states really not hurting much.  She walks with a flexed shuffled gait has not been limping.  Radiographs with aggressive arthritis right knee over the last year.  Patient felt that doing therapy was difficult and wants to avoid right total knee arthroplasty at this time since she does not want to have to go through therapy at home as well as outpatient.  Her left knee ended up doing well.  Review of Systems all systems noncontributory HPI.   Objective: Vital Signs: Ht 5\' 3"  (1.6 m)   Wt 180 lb (81.6 kg)   BMI 31.89 kg/m   Physical Exam Constitutional:      Appearance: She is well-developed.  HENT:     Head: Normocephalic.     Right Ear: External ear normal.     Left Ear: External ear normal. There is no impacted cerumen.  Eyes:     Pupils: Pupils are equal, round, and reactive to light.  Neck:     Thyroid: No thyromegaly.     Trachea: No tracheal deviation.   Cardiovascular:     Rate and Rhythm: Normal rate.  Pulmonary:     Effort: Pulmonary effort is normal.  Abdominal:     Palpations: Abdomen is soft.  Musculoskeletal:     Cervical back: No rigidity.  Skin:    General: Skin is warm and dry.  Neurological:     Mental Status: She is alert and oriented to person, place, and time.  Psychiatric:        Behavior: Behavior normal.     Ortho Exam well-healed left total knee arthroplasty she has full extension right knee.  Flexes to 110 degrees.  She is amatory without left knee limp.  Negative logroll hips.  Specialty Comments:  No specialty comments available.  Imaging: No results found.   PMFS History: Patient Active Problem List   Diagnosis Date Noted   Unilateral primary osteoarthritis, right knee 09/01/2022   Status post total knee replacement, left 04/04/2017   Acquired hypothyroidism 07/24/2014   Carotid artery stenosis 09/12/2012   Pounding in head 04/23/2012   Hypersomnolence 04/23/2012   Syncope and collapse 12/27/2011   Essential hypertension 12/27/2011   Past Medical History:  Diagnosis Date   Arthritis    spine   Diabetes  mellitus without complication ([facility])    type II   Heart murmur    patient denies   Hypertension    new onset    Hypothyroidism    Pulmonary embolism ([facility])    2016, was on eliquis immediately after, no longer on this   Syncope     No family history on file.  Past Surgical History:  Procedure Laterality Date   ABDOMINAL HYSTERECTOMY     CATARACT EXTRACTION     TONSILLECTOMY     TOTAL KNEE ARTHROPLASTY Left 04/04/2017   Procedure: LEFT TOTAL KNEE ARTHROPLASTY;  Surgeon: Mcarthur Rossetti, MD;  Location: [facility];  Service: Orthopedics;  Laterality: Left;   TUBAL LIGATION     Social History   Occupational History   Not on file  Tobacco Use   Smoking status: Never   Smokeless tobacco: Never  Vaping Use   Vaping Use: Never used  Substance and Sexual Activity   Alcohol use: No    Drug use: No   Sexual activity: Not Currently    Birth control/protection: Post-menopausal

## 2023-10-20 ENCOUNTER — Other Ambulatory Visit: Payer: Self-pay | Admitting: Orthopaedic Surgery

## 2023-10-20 DIAGNOSIS — L03116 Cellulitis of left lower limb: Secondary | ICD-10-CM | POA: Insufficient documentation

## 2023-10-25 ENCOUNTER — Ambulatory Visit
Admission: RE | Admit: 2023-10-25 | Discharge: 2023-10-25 | Disposition: A | Discharge status: Home / Self Care | Source: Ambulatory Visit | Attending: Orthopaedic Surgery | Admitting: Orthopaedic Surgery

## 2023-10-25 ENCOUNTER — Encounter: Payer: Self-pay | Admitting: Orthopaedic Surgery

## 2023-10-25 DIAGNOSIS — L03116 Cellulitis of left lower limb: Secondary | ICD-10-CM

## 2023-10-25 MED ORDER — GADOPICLENOL 0.5 MMOL/ML IV SOLN
7.5000 mL | Freq: Once | INTRAVENOUS | Status: AC | PRN
  Administered 2023-10-25: 7.5 mL via INTRAVENOUS

## 2023-11-13 ENCOUNTER — Encounter: Payer: Self-pay | Admitting: Infectious Disease

## 2023-11-13 DIAGNOSIS — M869 Osteomyelitis, unspecified: Secondary | ICD-10-CM | POA: Insufficient documentation

## 2023-11-13 HISTORY — DX: Osteomyelitis, unspecified: M86.9

## 2023-11-14 ENCOUNTER — Other Ambulatory Visit: Payer: Self-pay

## 2023-11-14 ENCOUNTER — Encounter: Payer: Self-pay | Admitting: Infectious Disease

## 2023-11-14 ENCOUNTER — Ambulatory Visit: Admitting: Infectious Disease

## 2023-11-14 VITALS — BP 134/73 | HR 78 | Resp 16 | Ht 63.0 in | Wt 174.0 lb

## 2023-11-14 DIAGNOSIS — I10 Essential (primary) hypertension: Secondary | ICD-10-CM

## 2023-11-14 DIAGNOSIS — M869 Osteomyelitis, unspecified: Secondary | ICD-10-CM

## 2023-11-14 DIAGNOSIS — O2230 Deep phlebothrombosis in pregnancy, unspecified trimester: Secondary | ICD-10-CM

## 2023-11-14 DIAGNOSIS — E119 Type 2 diabetes mellitus without complications: Secondary | ICD-10-CM

## 2023-11-14 DIAGNOSIS — M109 Gout, unspecified: Secondary | ICD-10-CM

## 2023-11-14 DIAGNOSIS — Z86711 Personal history of pulmonary embolism: Secondary | ICD-10-CM

## 2023-11-14 DIAGNOSIS — I8290 Acute embolism and thrombosis of unspecified vein: Secondary | ICD-10-CM

## 2023-11-14 NOTE — Progress Notes (Signed)
 Reason for infectious disease consult osteomyelitis of the toe  Requesting physician: Netta Cedars, MD  Subjective:    Patient ID: Claudia Rocha, female    DOB: 01-Sep-1940, 83 y.o.   MRN: [id number]  HPI  83 year old woman with a past medical history significant for diabetes mellitus hypertension hyperlipidemia, hypothyroidism gout presents to infectious disease clinic after referral by her orthopedic surgeon.  Currently she first developed symptoms in early February.  MRI of the left foot with and without contrast performed on 25 October 2023 showed edema within the second toe most pronounced in the middle and distal phalanx as well as the distal aspect the proximal phalanx.  No confluent low T1 signal changes evident findings are favored to represent reactive osteitis though early acute osteomyelitis is not entirely excluded.  Other labs of note that were drawn showed her to have a white blood cell count of 10.9 normal relatively normal hemoglobin 1.5 platelets 262 sedimentation rate of 33 and uric acid of 8.2  Discussed the use of AI scribe software for clinical note transcription with the patient, who gave verbal consent to proceed.  History of Present Illness   The patient, with a history of diabetes hypertension pulmonary embolism, presents with a swollen and painful second toe on her right foot. The symptoms started in imrproved briefly with antibiotic s and tmie but then returned. The patient first noticed the pain and swelling upon waking one day and had difficulty walking due to the discomfort. She sought medical attention the following day at an urgent care center, where she was prescribed antibiotics. The pain subsided over the next few days, but the swelling persisted. After another three to four weeks, the symptoms returned, prompting a visit to an orthopedic specialist,. Dr Ivy Lynn. An MRI was performed, which suggested osteitis vs early osteomyelitis. Labs showed elevated  ESR in 30s and uric acid of 8.2. The patient was then referred to her primary care physician, who prescribed allopurinol for suspected gout. The patient has been on allopurinol for about two weeks, during which the pain has subsided, but the swelling remains.       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    Social History   Socioeconomic History   Marital status: Married    Spouse name: Not on file   Number of children: Not on file   Years of education: Not on file   Highest education level: Not on file  Occupational History   Not on file  Tobacco Use   Smoking status: Never   Smokeless tobacco: Never  Vaping Use   Vaping status: Never Used  Substance and Sexual Activity   Alcohol use: No  Drug use: No   Sexual activity: Not Currently    Birth control/protection: Post-menopausal  Other Topics Concern   Not on file  Social History Narrative   Not on file   Social Drivers of Health   Financial Resource Strain: Low Risk  (10/30/2023)   Received from [facility]   Overall Financial Resource Strain (CARDIA)    Difficulty of Paying Living Expenses: Not hard at all  Food Insecurity: No Food Insecurity (10/30/2023)   Received from [facility]   Hunger Vital Sign    Worried About Running Out of Food in the Last Year:  Never true    Ran Out of Food in the Last Year: Never true  Transportation Needs: No Transportation Needs (10/30/2023)   Received from [facility] - Transportation    Lack of Transportation (Medical): No    Lack of Transportation (Non-Medical): No  Physical Activity: Insufficiently Active (06/19/2023)   Received from [facility]   Exercise Vital Sign    Days of Exercise per Week: 1 day    Minutes of Exercise per Session: 10 min  Stress: No Stress Concern Present (06/19/2023)   Received from [facility] - Occupational Stress Questionnaire    Feeling of Stress : Not at all  Social Connections: Moderately Integrated (06/19/2023)   Received from [facility]   Social Network    How would you rate your social network (family, work, friends)?: Adequate participation with social networks    Allergies  Allergen Reactions   Diclofenac Anaphylaxis and Hives   Diclofenac Sodium Hives     Current Outpatient Medications:    allopurinol (ZYLOPRIM) 100 MG tablet, Take by mouth., Disp: , Rfl:    doxycycline (VIBRAMYCIN) 100 MG capsule, Take 1 capsule twice a day by oral route for 7 days., Disp: , Rfl:    OZEMPIC, 1 MG/DOSE, 4 MG/3ML SOPN, Inject 1 mg into the skin once a week., Disp: , Rfl:    tretinoin (RETIN-A) 0.025 % cream, APPLY CREAM EXTERNALLY TO AFFECTED AREA AT BEDTIME, Disp: , Rfl:    amLODipine (NORVASC) 10 MG tablet, Take by mouth., Disp: , Rfl:    aspirin (ASPIRIN 81) 81 MG EC tablet, Take by mouth., Disp: , Rfl:    Calcium Carb-Cholecalciferol ([employer] SHELL CALCIUM+VIT D) 500-125 MG-UNIT TABS, , Disp: , Rfl:    Calcium-Magnesium-Vitamin D (CALCIUM 1200+D3 PO), Take 1 tablet by mouth daily., Disp: , Rfl:    Cholecalciferol 2000 units CAPS, Take by mouth., Disp: , Rfl:    Glucose Blood (BLOOD GLUCOSE TEST STRIPS) STRP, Check blood sugar as directed, Disp: , Rfl:    [employer] LANCETS ULTRATHIN 30G MISC, USE AS DIRECTED DAILY,  Disp: , Rfl: 2   levothyroxine (SYNTHROID, LEVOTHROID) 100 MCG tablet, TAKE 1 TABLET DAILY, Disp: , Rfl:    losartan (COZAAR) 50 MG tablet, TAKE 1 TABLET DAILY, Disp: , Rfl:    losartan-hydrochlorothiazide (HYZAAR) 100-25 MG tablet, , Disp: , Rfl:    metFORMIN (GLUCOPHAGE) 500 MG tablet, Take by mouth., Disp: , Rfl:    Review of Systems  Constitutional:  Negative for activity change, appetite change, chills, diaphoresis, fatigue, fever and unexpected weight change.  HENT:  Negative for congestion, rhinorrhea, sinus pressure, sneezing, sore throat and trouble swallowing.   Eyes:  Negative for photophobia and visual disturbance.  Respiratory:  Negative for cough, chest tightness, shortness of breath, wheezing and stridor.   Cardiovascular:  Negative for chest pain, palpitations  and leg swelling.  Gastrointestinal:  Negative for abdominal distention, abdominal pain, anal bleeding, blood in stool, constipation, diarrhea, nausea and vomiting.  Genitourinary:  Negative for difficulty urinating, dysuria, flank pain and hematuria.  Musculoskeletal:  Negative for arthralgias, back pain, gait problem, joint swelling and myalgias.  Skin:  Negative for color change, pallor, rash and wound.  Neurological:  Negative for dizziness, tremors, weakness and light-headedness.  Hematological:  Negative for adenopathy. Does not bruise/bleed easily.  Psychiatric/Behavioral:  Negative for agitation, behavioral problems, confusion, decreased concentration, dysphoric mood and sleep disturbance.        Objective:   Physical Exam Constitutional:      General: She is not in acute distress.    Appearance: Normal appearance. She is well-developed. She is not ill-appearing or diaphoretic.  HENT:     Head: Normocephalic and atraumatic.     Right Ear: Hearing and external ear normal.     Left Ear: Hearing and external ear normal.     Nose: No nasal deformity or rhinorrhea.  Eyes:     General: No scleral icterus.     Conjunctiva/sclera: Conjunctivae normal.     Right eye: Right conjunctiva is not injected.     Left eye: Left conjunctiva is not injected.     Pupils: Pupils are equal, round, and reactive to light.  Neck:     Vascular: No JVD.  Cardiovascular:     Rate and Rhythm: Normal rate and regular rhythm.     Heart sounds: Normal heart sounds, S1 normal and S2 normal. No murmur heard.    No friction rub.  Abdominal:     General: Bowel sounds are normal. There is no distension.     Palpations: Abdomen is soft.     Tenderness: There is no abdominal tenderness.  Musculoskeletal:     Right shoulder: Normal.     Left shoulder: Normal.     Cervical back: Normal range of motion and neck supple.     Right hip: Normal.     Left hip: Normal.     Right knee: Normal.     Left knee: Normal.     Left foot: Swelling present.  Lymphadenopathy:     Head:     Right side of head: No submandibular, preauricular or posterior auricular adenopathy.     Left side of head: No submandibular, preauricular or posterior auricular adenopathy.     Cervical: No cervical adenopathy.     Right cervical: No superficial or deep cervical adenopathy.    Left cervical: No superficial or deep cervical adenopathy.  Skin:    General: Skin is warm and dry.     Coloration: Skin is not pale.     Findings: No abrasion, bruising, ecchymosis, erythema, lesion or rash.     Nails: There is no clubbing.  Neurological:     Mental Status: She is alert and oriented to person, place, and time.     Sensory: No sensory deficit.     Coordination: Coordination normal.     Gait: Gait normal.  Psychiatric:        Attention and Perception: She is attentive.        Mood and Affect: Mood normal.        Speech: Speech normal.        Behavior: Behavior normal. Behavior is cooperative.        Thought Content: Thought content normal.        Judgment: Judgment normal.     11/14/2023:  2nd toe is tender but not terribly so       Assessment & Plan:   Assessment and Plan    Concern for osteomyelitis vs gout.    She presented with swelling and inflammation of the second toe, initially occurring in February and recurring after a few weeks. Symptoms included difficulty walking and pain, which improved with antibiotics and time but remained swollen. An MRI suggested possible osteitis or early bone infection, but the diagnosis was not definitive. While she denies being on colchicine it was on her med list from October 2024 rx. Allopurinol has also improved symptoms, suggesting gout as the likely diagnosis. The absence of fever, chills, or a wound, along with the sudden onset and improvement with gout medication, supports this diagnosis. A bone infection is considered unlikely given the clinical presentation and response to treatment. A definitive test for bone infection would involve bone biopsy, but current evidence does not support this intervention. A repeat MRI may be needed if symptoms persist or worsen, as it could provide more definitive evidence of infection.   I really feel the amount of pain she developed with the acuity she experienced fits more with gout than osteomyelitis. Pain from osteomyelitis is not typically as acutely severe and would continue even while on antibiotics and certainly worsen if being treated with allopurinol.  She has no history of injury to the toe or ulcer. - Continue allopurinol 200 mg daily - Schedule follow-up appointment in mid-May to reassess symptoms - Consider repeat MRI if symptoms persist or worsen - Monitor for signs of infection such as fever, chills, or increased swelling  Diabetes Mellitus Diabetes mellitus is well-controlled with a recent A1c of 6.1%. The duration of diabetes is several years, but it is not considered long-standing. The control of diabetes is relevant to the assessment of potential complications such as infections.  Hypertension Hypertension is managed with  medication. The specific medications were not discussed, but the condition is noted as part of her medical history.  Pulmonary Embolism She experienced a pulmonary embolism three to four years ago and is on anticoagulation therapy to prevent further thrombotic events. This history is relevant to her overall risk profile and management plan.  Follow-up Emphasized the importance of follow-up to monitor her condition and adjust the treatment plan as necessary. The follow-up will help determine if further diagnostic testing, such as an MRI, is needed based on her symptoms and response to treatment. - Schedule follow-up appointment in mid-May - Ensure appointment is in the early morning as per her preference      I have personally spent 80 minutes involved in face-to-face and non-face-to-face activities for this patient on the day of the visit. Professional time spent includes the following activities: Preparing to see the patient (review of tests), Obtaining and/or reviewing separately obtained history (admission/discharge record), Performing a medically appropriate examination and/or evaluation , Ordering medications/tests/procedures, referring and communicating with other health care professionals, Documenting clinical information in the EMR, Independently interpreting results (not separately reported), Communicating results to the patient/family/caregiver, Counseling and educating the patient/family/caregiver and Care coordination (not separately reported).

## 2023-12-27 ENCOUNTER — Ambulatory Visit: Payer: Self-pay | Admitting: Infectious Disease
